# Patient Record
Sex: Female | Born: 1980 | Race: White | Hispanic: No | Marital: Married | Smoking: Never smoker
Health system: Southern US, Community
[De-identification: ages and names within clinical notes are randomized; demographics above are authoritative.]

## PROBLEM LIST (undated history)

## (undated) DIAGNOSIS — R112 Nausea with vomiting, unspecified: Secondary | ICD-10-CM

## (undated) DIAGNOSIS — T8859XA Other complications of anesthesia, initial encounter: Secondary | ICD-10-CM

## (undated) DIAGNOSIS — T7840XA Allergy, unspecified, initial encounter: Secondary | ICD-10-CM

## (undated) DIAGNOSIS — Z9889 Other specified postprocedural states: Secondary | ICD-10-CM

## (undated) DIAGNOSIS — Z8489 Family history of other specified conditions: Secondary | ICD-10-CM

## (undated) DIAGNOSIS — E282 Polycystic ovarian syndrome: Secondary | ICD-10-CM

## (undated) DIAGNOSIS — O24419 Gestational diabetes mellitus in pregnancy, unspecified control: Secondary | ICD-10-CM

## (undated) DIAGNOSIS — K219 Gastro-esophageal reflux disease without esophagitis: Secondary | ICD-10-CM

## (undated) DIAGNOSIS — T4145XA Adverse effect of unspecified anesthetic, initial encounter: Secondary | ICD-10-CM

## (undated) DIAGNOSIS — R87629 Unspecified abnormal cytological findings in specimens from vagina: Secondary | ICD-10-CM

## (undated) HISTORY — PX: WISDOM TOOTH EXTRACTION: SHX21

## (undated) HISTORY — PX: CYST EXCISION: SHX5701

## (undated) HISTORY — DX: Polycystic ovarian syndrome: E28.2

## (undated) HISTORY — DX: Allergy, unspecified, initial encounter: T78.40XA

## (undated) HISTORY — PX: MOUTH SURGERY: SHX715

---
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
## 2012-03-12 HISTORY — PX: HYSTEROSCOPY: SHX211

## 2017-10-25 DIAGNOSIS — Z01419 Encounter for gynecological examination (general) (routine) without abnormal findings: Secondary | ICD-10-CM | POA: Diagnosis not present

## 2017-10-25 LAB — HM PAP SMEAR

## 2017-11-30 DIAGNOSIS — Z23 Encounter for immunization: Secondary | ICD-10-CM | POA: Diagnosis not present

## 2017-12-13 DIAGNOSIS — Z1321 Encounter for screening for nutritional disorder: Secondary | ICD-10-CM | POA: Diagnosis not present

## 2017-12-13 DIAGNOSIS — N978 Female infertility of other origin: Secondary | ICD-10-CM | POA: Diagnosis not present

## 2017-12-13 DIAGNOSIS — E559 Vitamin D deficiency, unspecified: Secondary | ICD-10-CM | POA: Diagnosis not present

## 2017-12-13 DIAGNOSIS — Z369 Encounter for antenatal screening, unspecified: Secondary | ICD-10-CM | POA: Diagnosis not present

## 2017-12-13 DIAGNOSIS — N809 Endometriosis, unspecified: Secondary | ICD-10-CM | POA: Diagnosis not present

## 2017-12-13 DIAGNOSIS — E282 Polycystic ovarian syndrome: Secondary | ICD-10-CM | POA: Diagnosis not present

## 2018-01-01 DIAGNOSIS — Z3200 Encounter for pregnancy test, result unknown: Secondary | ICD-10-CM | POA: Diagnosis not present

## 2018-01-01 DIAGNOSIS — Z3183 Encounter for assisted reproductive fertility procedure cycle: Secondary | ICD-10-CM | POA: Diagnosis not present

## 2018-01-15 DIAGNOSIS — Z3183 Encounter for assisted reproductive fertility procedure cycle: Secondary | ICD-10-CM | POA: Diagnosis not present

## 2018-01-21 DIAGNOSIS — J029 Acute pharyngitis, unspecified: Secondary | ICD-10-CM | POA: Diagnosis not present

## 2018-01-22 DIAGNOSIS — Z3183 Encounter for assisted reproductive fertility procedure cycle: Secondary | ICD-10-CM | POA: Diagnosis not present

## 2018-01-30 DIAGNOSIS — N978 Female infertility of other origin: Secondary | ICD-10-CM | POA: Diagnosis not present

## 2018-01-30 DIAGNOSIS — Z3183 Encounter for assisted reproductive fertility procedure cycle: Secondary | ICD-10-CM | POA: Diagnosis not present

## 2018-02-13 DIAGNOSIS — Z3200 Encounter for pregnancy test, result unknown: Secondary | ICD-10-CM | POA: Diagnosis not present

## 2018-02-17 DIAGNOSIS — Z3200 Encounter for pregnancy test, result unknown: Secondary | ICD-10-CM | POA: Diagnosis not present

## 2018-02-20 DIAGNOSIS — O09819 Supervision of pregnancy resulting from assisted reproductive technology, unspecified trimester: Secondary | ICD-10-CM | POA: Diagnosis not present

## 2018-02-27 DIAGNOSIS — O09819 Supervision of pregnancy resulting from assisted reproductive technology, unspecified trimester: Secondary | ICD-10-CM | POA: Diagnosis not present

## 2018-03-06 DIAGNOSIS — O09819 Supervision of pregnancy resulting from assisted reproductive technology, unspecified trimester: Secondary | ICD-10-CM | POA: Diagnosis not present

## 2018-03-13 DIAGNOSIS — O09819 Supervision of pregnancy resulting from assisted reproductive technology, unspecified trimester: Secondary | ICD-10-CM | POA: Diagnosis not present

## 2018-03-13 DIAGNOSIS — Z3201 Encounter for pregnancy test, result positive: Secondary | ICD-10-CM | POA: Diagnosis not present

## 2018-03-25 DIAGNOSIS — O09891 Supervision of other high risk pregnancies, first trimester: Secondary | ICD-10-CM | POA: Diagnosis not present

## 2018-03-27 ENCOUNTER — Other Ambulatory Visit: Payer: Self-pay | Admitting: Obstetrics and Gynecology

## 2018-03-27 DIAGNOSIS — Z369 Encounter for antenatal screening, unspecified: Secondary | ICD-10-CM

## 2018-03-30 LAB — OB RESULTS CONSOLE RUBELLA ANTIBODY, IGM: Rubella: NON-IMMUNE/NOT IMMUNE

## 2018-03-30 LAB — OB RESULTS CONSOLE VARICELLA ZOSTER ANTIBODY, IGG: Varicella: IMMUNE

## 2018-03-30 LAB — OB RESULTS CONSOLE HEPATITIS B SURFACE ANTIGEN: Hepatitis B Surface Ag: NEGATIVE

## 2018-04-01 DIAGNOSIS — O09891 Supervision of other high risk pregnancies, first trimester: Secondary | ICD-10-CM | POA: Diagnosis not present

## 2018-04-14 ENCOUNTER — Ambulatory Visit
Admission: RE | Admit: 2018-04-14 | Discharge: 2018-04-14 | Disposition: A | Discharge status: Home / Self Care | Payer: 59 | Source: Ambulatory Visit | Attending: Obstetrics and Gynecology | Admitting: Obstetrics and Gynecology

## 2018-04-14 ENCOUNTER — Ambulatory Visit (HOSPITAL_BASED_OUTPATIENT_CLINIC_OR_DEPARTMENT_OTHER)
Admission: RE | Admit: 2018-04-14 | Discharge: 2018-04-14 | Disposition: A | Discharge status: Home / Self Care | Payer: 59 | Source: Ambulatory Visit | Attending: Obstetrics and Gynecology | Admitting: Obstetrics and Gynecology

## 2018-04-14 VITALS — BP 130/83 | HR 115 | Temp 97.6°F | Resp 18 | Ht 64.0 in | Wt 180.0 lb

## 2018-04-14 DIAGNOSIS — Z315 Encounter for genetic counseling: Secondary | ICD-10-CM | POA: Insufficient documentation

## 2018-04-14 DIAGNOSIS — Z369 Encounter for antenatal screening, unspecified: Secondary | ICD-10-CM

## 2018-04-14 HISTORY — DX: Unspecified abnormal cytological findings in specimens from vagina: R87.629

## 2018-04-14 HISTORY — DX: Gastro-esophageal reflux disease without esophagitis: K21.9

## 2018-04-14 HISTORY — DX: Gestational diabetes mellitus in pregnancy, unspecified control: O24.419

## 2018-04-14 NOTE — Progress Notes (Addendum)
Referring Provider:  Christeen Douglas Length of Consultation: 25 minutes  Rebekah Cook was referred to [facility] for genetic counseling because of advanced maternal age.  The patient will be 38 years old at the time of delivery.  This note summarizes the information we discussed.    We explained that the chance of a chromosome abnormality increases with maternal age.  Chromosomes and examples of chromosome problems were reviewed.  Humans typically have 46 chromosomes in each cell, with half passed through each sperm and egg.  Any change in the number or structure of chromosomes can increase the risk of problems in the physical and mental development of a pregnancy.  However, this pregnancy was conceived through IVF performed when the patient was 38 years old.  Therefore, the maternal age related risks would be those of a 37 year old, rather than a 37 year old.  In addition, the patient reported that at the time of her IVF cycle, the couple elected to have the Natera 24 Chromosome Preimplantation Genetic Screen which indicated that the embryo used for this pregnancy was a low risk female embryo. She did not have a copy of these results, but per the [facility] website, the detection rate of this testing is greater than 99%. Even though embryo screening was performed, it is recommended to still offer routine screening and testing during the pregnancy for completeness or in case the pregnancy were to have to occured spontaneously apart from the IVF transfer.  We discussed the following prenatal screening and testing options for this pregnancy:  First trimester screening, which includes nuchal translucency ultrasound screen and first trimester maternal serum marker screening.  The nuchal translucency has approximately an 80% detection rate for Down syndrome and can be positive for other chromosome abnormalities as well as heart defects.  When combined with a maternal serum marker  screening, the detection rate is up to 90% for Down syndrome and up to 97% for trisomy 18.     The chorionic villus sampling procedure is available for first trimester chromosome analysis.  This involves the withdrawal of a small amount of chorionic villi (tissue from the developing placenta).  Risk of pregnancy loss is estimated to be approximately 1 in 200 to 1 in 100 (0.5 to 1%).  There is approximately a 1% (1 in 100) chance that the CVS chromosome results will be unclear.  Chorionic villi cannot be tested for neural tube defects.     Maternal serum marker screening, a blood test that measures pregnancy proteins, can provide risk assessments for Down syndrome, trisomy 18, and open neural tube defects (spina bifida, anencephaly). Because it does not directly examine the fetus, it cannot positively diagnose or rule out these problems.  Targeted ultrasound uses high frequency sound waves to create an image of the developing fetus.  An ultrasound is often recommended as a routine means of evaluating the pregnancy.  It is also used to screen for fetal anatomy problems (for example, a heart defect) that might be suggestive of a chromosomal or other abnormality.   Amniocentesis involves the removal of a small amount of amniotic fluid from the sac surrounding the fetus with the use of a thin needle inserted through the maternal abdomen and uterus.  Ultrasound guidance is used throughout the procedure.  Fetal cells from amniotic fluid are directly evaluated and > 99.5% of chromosome problems and > 98% of open neural tube defects can be detected. This procedure is generally performed after the 15th  week of pregnancy.  The main risks to this procedure include complications leading to miscarriage in less than 1 in 200 cases (0.5%).  We also reviewed the availability of cell free fetal DNA testing from maternal blood to determine whether or not the baby may have either Down syndrome, trisomy 813, or trisomy 4918.   This test utilizes a maternal blood sample and DNA sequencing technology to isolate circulating cell free fetal DNA from maternal plasma.  The fetal DNA can then be analyzed for DNA sequences that are derived from the three most common chromosomes involved in aneuploidy, chromosomes 13, 18, and 21.  If the overall amount of DNA is greater than the expected level for any of these chromosomes, aneuploidy is suspected.  While we do not consider it a replacement for invasive testing and karyotype analysis, a negative result from this testing would be reassuring, though not a guarantee of a normal chromosome complement for the baby.  An abnormal result is certainly suggestive of an abnormal chromosome complement, though we would still recommend CVS or amniocentesis to confirm any findings from this testing.  Cystic Fibrosis and Spinal Muscular Atrophy (SMA) screening were also discussed with the patient. Both conditions are recessive, which means that both parents must be carriers in order to have a child with the disease.  Cystic fibrosis (CF) is one of the most common genetic conditions in persons of Caucasian ancestry.  This condition occurs in approximately 1 in 2,500 Caucasian persons and results in thickened secretions in the lungs, digestive, and reproductive systems.  For a baby to be at risk for having CF, both of the parents must be carriers for this condition.  Approximately 1 in 7025 Caucasian persons is a carrier for CF.  Current carrier testing looks for the most common mutations in the gene for CF and can detect approximately 90% of carriers in the Caucasian population.  This means that the carrier screening can greatly reduce, but cannot eliminate, the chance for an individual to have a child with CF.  If an individual is found to be a carrier for CF, then carrier testing would be available for the partner. As part of [name] newborn screening profile, all babies born in the [place] will have a two-tier screening process.  Specimens are first tested to determine the concentration of immunoreactive trypsinogen (IRT).  The top 5% of specimens with the highest IRT values then undergo DNA testing using a panel of over 40 common CF mutations. SMA is a neurodegenerative disorder that leads to atrophy of skeletal muscle and overall weakness.  This condition is also more prevalent in the Caucasian population, with 1 in 40-1 in 60 persons being a carrier and 1 in 6,000-1 in 10,000 children being affected.  There are multiple forms of the disease, with some causing death in infancy to other forms with survival into adulthood.  The genetics of SMA is complex, but carrier screening can detect up to 95% of carriers in the Caucasian population.  Similar to CF, a negative result can greatly reduce, but cannot eliminate, the chance to have a child with SMA. Rebekah Cook stated that this carrier screening was performed prior to her IVF cycle and was all normal.  We inquired about the family history and pregnancy history.  Rebekah Cook reported that the family history is unremarkable for birth defects, developmental delays, recurrent pregnancy loss or known chromosome abnormalities.  Rebekah Cook stated that this is her second pregnancy.  The couple has a 79 year old daughter who is in good health.  She reported no complications or exposures in this pregnancy that are expected to increase the risk for birth defects.  After consideration of the options, Rebekah Cook wanted to consider MaterniT21 testing depending upon insurance coverage.  I called the lab and was told that her estimated cost is $125.  She was informed of this and planned to speak with her husband and call back with her decision about this testing. She was scheduled to return to [facility] at [redacted] weeks gestation for an anatomy ultrasound and a request for the fetal echocardiogram was also sent to [facility]  in [place].  An ultrasound was performed at the time of the visit.  The gestational age was consistent with  13 weeks.  Fetal anatomy could not be assessed due to early gestational age.  Please refer to the ultrasound report for details of that study. We recommend a detailed anatomy ultrasound in the second trimester.  We also offered the option of a fetal echocardiogram after [redacted] weeks gestation due to the history of IVF in this pregnancy.  Rebekah Cook was encouraged to call with questions or concerns.  We can be contacted at [phone].   Tests Ordered: none today  Cherly Anderson, MS, CGC  I saw the patient and concur with the genetic counselors summary  Jimmey Ralph, MD

## 2018-04-17 DIAGNOSIS — Z3A13 13 weeks gestation of pregnancy: Secondary | ICD-10-CM | POA: Diagnosis not present

## 2018-04-17 DIAGNOSIS — J101 Influenza due to other identified influenza virus with other respiratory manifestations: Secondary | ICD-10-CM | POA: Diagnosis not present

## 2018-04-17 DIAGNOSIS — R6889 Other general symptoms and signs: Secondary | ICD-10-CM | POA: Diagnosis not present

## 2018-04-30 DIAGNOSIS — O0992 Supervision of high risk pregnancy, unspecified, second trimester: Secondary | ICD-10-CM | POA: Diagnosis not present

## 2018-05-15 ENCOUNTER — Other Ambulatory Visit: Payer: Self-pay

## 2018-05-15 DIAGNOSIS — O09522 Supervision of elderly multigravida, second trimester: Secondary | ICD-10-CM

## 2018-05-19 ENCOUNTER — Ambulatory Visit
Admission: RE | Admit: 2018-05-19 | Discharge: 2018-05-19 | Disposition: A | Discharge status: Home / Self Care | Payer: 59 | Source: Ambulatory Visit | Attending: Obstetrics and Gynecology | Admitting: Obstetrics and Gynecology

## 2018-05-19 DIAGNOSIS — O09522 Supervision of elderly multigravida, second trimester: Secondary | ICD-10-CM

## 2018-07-24 ENCOUNTER — Other Ambulatory Visit: Payer: Self-pay

## 2018-07-24 DIAGNOSIS — O09522 Supervision of elderly multigravida, second trimester: Secondary | ICD-10-CM

## 2018-07-28 ENCOUNTER — Ambulatory Visit
Admission: RE | Admit: 2018-07-28 | Discharge: 2018-07-28 | Disposition: A | Discharge status: Home / Self Care | Payer: 59 | Source: Ambulatory Visit | Attending: Maternal & Fetal Medicine | Admitting: Maternal & Fetal Medicine

## 2018-07-28 ENCOUNTER — Other Ambulatory Visit: Payer: Self-pay

## 2018-07-28 DIAGNOSIS — Z3A28 28 weeks gestation of pregnancy: Secondary | ICD-10-CM | POA: Insufficient documentation

## 2018-07-28 DIAGNOSIS — O09522 Supervision of elderly multigravida, second trimester: Secondary | ICD-10-CM | POA: Diagnosis not present

## 2018-08-27 ENCOUNTER — Other Ambulatory Visit: Payer: Self-pay

## 2018-08-27 ENCOUNTER — Encounter: Payer: 59 | Attending: Obstetrics and Gynecology | Admitting: *Deleted

## 2018-08-27 ENCOUNTER — Encounter: Payer: Self-pay | Admitting: *Deleted

## 2018-08-27 VITALS — BP 114/70 | Ht 64.0 in | Wt 195.8 lb

## 2018-08-27 DIAGNOSIS — O24410 Gestational diabetes mellitus in pregnancy, diet controlled: Secondary | ICD-10-CM | POA: Insufficient documentation

## 2018-08-27 DIAGNOSIS — Z3A00 Weeks of gestation of pregnancy not specified: Secondary | ICD-10-CM | POA: Diagnosis not present

## 2018-08-27 NOTE — Progress Notes (Signed)
Diabetes Self-Management Education  Visit Type: First/Initial  Appt. Start Time: 0915 Appt. End Time: 1050  08/27/2018  [name], identified by name and date of birth, is a 38 y.o. female with a diagnosis of Diabetes: Gestational Diabetes.   ASSESSMENT  Blood pressure 114/70, height 5\' 4"  (1.626 m), weight 195 lb 12.8 oz (88.8 kg), last menstrual period 01/11/2018, estimated due date 10/18/2018 Body mass index is 33.61 kg/m.  Diabetes Self-Management Education - 08/27/18 1023      Visit Information   Visit Type  First/Initial      Initial Visit   Diabetes Type  Gestational Diabetes    Are you currently following a meal plan?  No    Are you taking your medications as prescribed?  Yes    Date Diagnosed  last week      Health Coping   How would you rate your overall health?  Good      Psychosocial Assessment   Patient Belief/Attitude about Diabetes  Motivated to manage diabetes    Self-care barriers  None    Self-management support  Doctor's office;Family    Patient Concerns  Nutrition/Meal planning;Monitoring;Healthy Lifestyle;Other (comment)   "have a healthy baby that stays put until term"   Special Needs  None    Preferred Learning Style  Visual    Learning Readiness  Ready    How often do you need to have someone help you when you read instructions, pamphlets, or other written materials from your doctor or pharmacy?  1 - Never    What is the last grade level you completed in school?  BS      Pre-Education Assessment   Patient understands the diabetes disease and treatment process.  Needs Review    Patient understands incorporating nutritional management into lifestyle.  Needs Instruction    Patient undertands incorporating physical activity into lifestyle.  Needs Review    Patient understands using medications safely.  Needs Instruction    Patient understands monitoring blood glucose, interpreting and using results  Needs Instruction    Patient understands  prevention, detection, and treatment of acute complications.  Needs Instruction    Patient understands prevention, detection, and treatment of chronic complications.  Needs Review    Patient understands how to develop strategies to address psychosocial issues.  Needs Review    Patient understands how to develop strategies to promote health/change behavior.  Needs Review      Complications   Last HgB A1C per patient/outside source  5.5 %   08/07/2018   How often do you check your blood sugar?  0 times/day (not testing)   Provided One Touch Verio Flex meter and instructed on use. BG upon return demonstration was 83 mg/dL at 09:8110:40 am - 3 hrs pp.   Have you had a dilated eye exam in the past 12 months?  Yes    Have you had a dental exam in the past 12 months?  Yes    Are you checking your feet?  No      Dietary Intake   Breakfast  milk, egg sandwich, [employer] breakfast bowl    Lunch  tuna and crackers, Malaw[place]urkey and cheese with triskets, chicken rice and broccoli, fruit (apple, mandarin oranges, grapes, banana    Snack (afternoon)  pretzels, hummus, carrots, popcorn    Dinner  chicken and fish; potatoes, corn, rice, broccoli, lettuce tomatoes, spinach    Beverage(s)  water, milk, juice      Exercise   Exercise  Type  Light (walking / raking leaves)    How many days per week to you exercise?  4    How many minutes per day do you exercise?  20    Total minutes per week of exercise  80      Patient Education   Previous Diabetes Education  Yes (please comment)   in [place] 4 years ago with first pregnany   Disease state   Definition of diabetes, type 1 and 2, and the diagnosis of diabetes;Factors that contribute to the development of diabetes    Nutrition management   Role of diet in the treatment of diabetes and the relationship between the three main macronutrients and blood glucose level;Reviewed blood glucose goals for pre and post meals and how to evaluate the patients' food intake on  their blood glucose level.    Physical activity and exercise   Role of exercise on diabetes management, blood pressure control and cardiac health.    Monitoring  Taught/evaluated SMBG meter.;Purpose and frequency of SMBG.;Taught/discussed recording of test results and interpretation of SMBG.;Ketone testing, when, how.    Chronic complications  Relationship between chronic complications and blood glucose control    Psychosocial adjustment  Identified and addressed patients feelings and concerns about diabetes    Preconception care  Pregnancy and GDM  Role of pre-pregnancy blood glucose control on the development of the fetus;Reviewed with patient blood glucose goals with pregnancy;Role of family planning for patients with diabetes      Individualized Goals (developed by patient)   Reducing Risk Prevent diabetes complications Lead a healthier lifestyle Have a healthy baby until term     Outcomes   Expected Outcomes  Demonstrated interest in learning. Expect positive outcomes       Individualized Plan for Diabetes Self-Management Training:   Learning Objective:  Patient will have a greater understanding of diabetes self-management. Patient education plan is to attend individual and/or group sessions per assessed needs and concerns.   Plan:   Patient Instructions  Read booklet on Gestational Diabetes Follow Gestational Meal Planning Guidelines Avoid sugar sweetened drinks (juice) Complete a 3 Day Food Record and bring to next appointment Check blood sugars 4 x day - before breakfast and 2 hrs after every meal and record  Bring blood sugar log to all appointments Call MD for prescription for meter strips and lancets Strips   One Touch Verio Lancets   One Touch Delica Purchase urine ketone strips if ordered by MD and check urine ketones every am:  If + increase bedtime snack to 1 protein and 2 carbohydrate servings Walk 20-30 minutes at least 5 x week if permitted by MD  Expected  Outcomes:  Demonstrated interest in learning. Expect positive outcomes  Education material provided:  Gestational Booklet Gestational Meal Planning Guidelines Simple Meal Plan Viewed Gestational Diabetes Video Meter = One Touch Verio Flex 3 Day Food Record Goals for a Healthy Pregnancy  If problems or questions, patient to contact team via:  Johny Drilling, RN, [place], CDE [phone]  Future DSME appointment: September 01, 2018 with the dietitian

## 2018-08-27 NOTE — Patient Instructions (Signed)
Read booklet on Gestational Diabetes Follow Gestational Meal Planning Guidelines Avoid sugar sweetened drinks (juice) Complete a 3 Day Food Record and bring to next appointment Check blood sugars 4 x day - before breakfast and 2 hrs after every meal and record  Bring blood sugar log to all appointments Call MD for prescription for meter strips and lancets Strips   One Touch Verio Lancets   One Touch Delica Purchase urine ketone strips if ordered by MD and check urine ketones every am:  If + increase bedtime snack to 1 protein and 2 carbohydrate servings Walk 20-30 minutes at least 5 x week if permitted by MD

## 2018-09-01 ENCOUNTER — Encounter: Payer: Self-pay | Admitting: Dietician

## 2018-09-01 ENCOUNTER — Encounter: Payer: 59 | Admitting: Dietician

## 2018-09-01 ENCOUNTER — Other Ambulatory Visit: Payer: Self-pay

## 2018-09-01 VITALS — BP 118/70 | Ht 64.0 in | Wt 195.0 lb

## 2018-09-01 DIAGNOSIS — O24410 Gestational diabetes mellitus in pregnancy, diet controlled: Secondary | ICD-10-CM

## 2018-09-01 NOTE — Patient Instructions (Addendum)
Balanced breakfast with 2 servings of carbohydrate and at least 2 oz of protein. Avoid fruit and fruit juice at breakfast. Balance lunch and dinner with 2-4 oz protein, 2-4 servings of carbohydrate and non-starchy vegetables. Include 3 milk products per day (milk, yogurt, cheese). Follow guidelines on Food Safety regarding prevention of listeriosis and food borne illness as well as fish recommendations. Continue to check blood sugars: before breakfast and 2 hours after all meals.

## 2018-09-01 NOTE — Progress Notes (Signed)
Diabetes Self-Management Education  Visit Type:  Follow-up  Appt. Start Time: 10:30am  Appt. End Time: 11:30 am  09/01/2018  Rebekah Cook, identified by name and date of birth, is a 38 y.o. female with a diagnosis of Diabetes: Gestational diabetes ASSESSMENT  Blood pressure 118/70, height 5\' 4"  (1.626 m), weight 195 lb (88.5 kg), last menstrual period 01/11/2018. Body mass index is 33.47 kg/m.   Diabetes Self-Management Education - 09/01/18 1138      Complications   Last HgB A1C per patient/outside source  5.5 %    How often do you check your blood sugar?  3-4 times/day    Fasting Blood glucose range (mg/dL)  [phone]    Postprandial Blood glucose range (mg/dL)  [phone]    Have you had a dilated eye exam in the past 12 months?  Yes    Have you had a dental exam in the past 12 months?  Yes    Are you checking your feet?  No      Dietary Intake   Breakfast  8-9:30am- breakfast bowl, 8-10 oz milk or 1 slice bread, peanut butter,milk    Lunch  12-1:30pm- 6 triscuits, cheese, 3 slices Malawiturkey or 12 grilled chicken nuggets, 12 grapes, 6 triscuits or 12 saltines, cheese, Mal[place]iturkey, mayo    Snack (afternoon)  fruit or carrots and hummus    Dinner  4:30-7:00- 2 hard boiled eggs, 2 slices ww bread, 1 apple or 2 slices bread, tuna with mayo, apple or grilled salmon, broccoli, mashed potatos    Snack (evening)  popcorn -approx. 6 cups    Beverage(s)  water, milk      Exercise   Exercise Type  ADL's      Patient Education   Nutrition management   Role of diet in the treatment of diabetes and the relationship between the three main macronutrients and blood glucose level;Food label reading, portion sizes and measuring food.;Carbohydrate counting;Reviewed blood glucose goals for pre and post meals and how to evaluate the patients' food intake on their blood glucose level.    Monitoring  Taught/discussed recording of test results and interpretation of SMBG.    Preconception care  Pregnancy and  GDM  Role of pre-pregnancy blood glucose control on the development of the fetus;Reviewed with patient blood glucose goals with pregnancy;Role of family planning for patients with diabetes      Individualized Goals (developed by patient)   Nutrition  Follow meal plan discussed;General guidelines for healthy choices and portions discussed    Monitoring   test my blood glucose as discussed      Post-Education Assessment   Patient understands the diabetes disease and treatment process.  Demonstrates understanding / competency    Patient understands incorporating nutritional management into lifestyle.  Demonstrates understanding / competency    Patient undertands incorporating physical activity into lifestyle.  Demonstrates understanding / competency    Patient understands monitoring blood glucose, interpreting and using results  Demonstrates understanding / competency    Patient understands prevention, detection, and treatment of acute complications.  Demonstrates understanding / competency    Patient understands prevention, detection, and treatment of chronic complications.  Demonstrates understanding / competency    Patient understands how to develop strategies to address psychosocial issues.  Demonstrates understanding / competency    Patient understands how to develop strategies to promote health/change behavior.  Demonstrates understanding / competency      Outcomes   Program Status  Completed       Learning Objective:  Patient will have a greater understanding of diabetes self-management for Gestational diabetes  Education: Instructed on a meal plan for Gestational diabetes based on 1900 calories including carbohydrate counting, portion control and how to balance carbohydrate, protein, healthy fats and non-starchy vegetables.  Instructed patient on food safety, including avoidance of Listeriosis, and limiting mercury from fish.  Discussed importance of maintaining healthy lifestyle  habits to reduce risk of Type 2 DM as well as Gestational DM with any future pregnancies.  Advised patient to use any remaining testing supplies to test some BGs after delivery, and to have BG tested ideally annually, as well as prior to attempting future pregnancies.  Plan:   Patient Instructions  Balanced breakfast with 2 servings of carbohydrate and at least 2 oz of protein. Avoid fruit and fruit juice at breakfast. Balance lunch and dinner with 2-4 oz protein, 2-4 servings of carbohydrate and non-starchy vegetables. Include 3 milk products per day (milk, yogurt, cheese). Follow guidelines on Food Safety regarding prevention of listeriosis and food borne illness as well as fish recommendations. Continue to check blood sugars: before breakfast and 2 hours after all meals.   Expected Outcomes:  Demonstrated interest in learning. Expect positive outcomes  Education material provided:  Food guide plate Planning a Balanced Meal Menu examples Food Safety Recommendations regarding fish intake Preventing diabetes-post pregnancy  If problems or questions, patient to contact team via:   Karolee Stamps, [place], [place]  Future DSME appointment: - PRN

## 2018-09-23 LAB — OB RESULTS CONSOLE RPR: RPR: NONREACTIVE

## 2018-09-23 LAB — OB RESULTS CONSOLE GC/CHLAMYDIA
Chlamydia: NEGATIVE
Gonorrhea: NEGATIVE

## 2018-09-23 LAB — OB RESULTS CONSOLE HIV ANTIBODY (ROUTINE TESTING): HIV: NONREACTIVE

## 2018-09-23 LAB — OB RESULTS CONSOLE GBS: GBS: NEGATIVE

## 2018-10-06 ENCOUNTER — Other Ambulatory Visit: Payer: Self-pay

## 2018-10-06 ENCOUNTER — Encounter
Admission: RE | Admit: 2018-10-06 | Discharge: 2018-10-06 | Disposition: A | Discharge status: Home / Self Care | Payer: 59 | Source: Ambulatory Visit | Attending: Obstetrics & Gynecology | Admitting: Obstetrics & Gynecology

## 2018-10-06 DIAGNOSIS — Z01818 Encounter for other preprocedural examination: Secondary | ICD-10-CM | POA: Insufficient documentation

## 2018-10-06 DIAGNOSIS — Z20828 Contact with and (suspected) exposure to other viral communicable diseases: Secondary | ICD-10-CM | POA: Diagnosis present

## 2018-10-06 HISTORY — DX: Other specified postprocedural states: R11.2

## 2018-10-06 HISTORY — DX: Other complications of anesthesia, initial encounter: T88.59XA

## 2018-10-06 HISTORY — DX: Family history of other specified conditions: Z84.89

## 2018-10-06 HISTORY — DX: Other specified postprocedural states: Z98.890

## 2018-10-06 NOTE — Patient Instructions (Signed)
Your procedure is scheduled on: 10-13-18 MONDAY Report to [place] TO LABOR AND DELIVERY ON 3RD FLOOR-ARRIVE AT 5:30 AM  Remember: Instructions that are not followed completely may result in serious medical risk, up to and including death, or upon the discretion of your surgeon and anesthesiologist your surgery may need to be rescheduled.    _x___ 1. Do not eat food after midnight the night before your procedure. NO GUM OR CANDY AFTER MIDNIGHT. You may drink clear liquids up to 2 hours before you are scheduled to arrive at the hospital for your procedure.  Do not drink clear liquids within 2 hours of your scheduled arrival to the hospital.  Clear liquids include  --Water or Apple juice without pulp  --Clear carbohydrate beverage such as ClearFast or Gatorade  --Black Coffee or Clear Tea (No milk, no creamers, do not add anything to the coffee or Tea   ____Ensure clear carbohydrate drink on the way to the hospital for bariatric patients  ____Ensure clear carbohydrate drink 3 hours before surgery.     __x__ 2. No Alcohol for 24 hours before or after surgery.   __x__3. No Smoking or e-cigarettes for 24 prior to surgery.  Do not use any chewable tobacco products for at least 6 hour prior to surgery   ____  4. Bring all medications with you on the day of surgery if instructed.    __x__ 5. Notify your doctor if there is any change in your medical condition     (cold, fever, infections).    x___6. On the morning of surgery brush your teeth with toothpaste and water.  You may rinse your mouth with mouth wash if you wish.  Do not swallow any toothpaste or mouthwash.   Do not wear jewelry, make-up, hairpins, clips or nail polish.  Do not wear lotions, powders, or perfumes. You may wear deodorant.  Do not shave 48 hours prior to surgery. Men may shave face and neck.  Do not bring valuables to the hospital.    [facility] is not responsible for any belongings  or valuables.               Contacts, dentures or bridgework may not be worn into surgery.  Leave your suitcase in the car. After surgery it may be brought to your room.  For patients admitted to the hospital, discharge time is determined by your treatment team.  _  Patients discharged the day of surgery will not be allowed to drive home.  You will need someone to drive you home and stay with you the night of your procedure.    Please read over the following fact sheets that you were given:   [facility] Preparing for Surgery   _x___ TAKE THE FOLLOWING MEDICATION THE MORNING OF SURGERY WITH A SMALL SIP OF WATER. These include:  1. PEPCID (FAMOTIDINE)  2.  3.  4.  5.  6.  ____Fleets enema or Magnesium Citrate as directed.   _x___ Use CHG Soap or sage wipes as directed on instruction sheet   ____ Use inhalers on the day of surgery and bring to hospital day of surgery  ____ Stop Metformin and Janumet 2 days prior to surgery.    ____ Take 1/2 of usual insulin dose the night before surgery and none on the morning surgery.   ____ Follow recommendations from Cardiologist, Pulmonologist or PCP regarding  stopping Aspirin, Coumadin, Plavix ,Eliquis, Effient, or Pradaxa, and Pletal.  X____Stop  Anti-inflammatories such as Advil, Aleve, Ibuprofen, Motrin, Naproxen, Naprosyn, Goodies powders or aspirin products NOW-OK to take Tylenol    ____ Stop supplements until after surgery.   ____ Bring C-Pap to the hospital.

## 2018-10-09 ENCOUNTER — Ambulatory Visit
Admission: RE | Admit: 2018-10-09 | Discharge: 2018-10-09 | Disposition: A | Discharge status: Home / Self Care | Payer: 59 | Source: Ambulatory Visit | Attending: Obstetrics & Gynecology | Admitting: Obstetrics & Gynecology

## 2018-10-09 ENCOUNTER — Other Ambulatory Visit: Payer: Self-pay

## 2018-10-09 DIAGNOSIS — Z20828 Contact with and (suspected) exposure to other viral communicable diseases: Secondary | ICD-10-CM | POA: Insufficient documentation

## 2018-10-09 LAB — SARS CORONAVIRUS 2 (TAT 6-24 HRS): SARS Coronavirus 2: NEGATIVE

## 2018-10-13 ENCOUNTER — Inpatient Hospital Stay: Payer: 59 | Admitting: Anesthesiology

## 2018-10-13 ENCOUNTER — Encounter
Admission: RE | Disposition: A | Discharge status: Home / Self Care | Payer: Self-pay | Source: Home / Self Care | Attending: Obstetrics & Gynecology

## 2018-10-13 ENCOUNTER — Inpatient Hospital Stay
Admission: RE | Admit: 2018-10-13 | Discharge: 2018-10-15 | DRG: 787 | Disposition: A | Discharge status: Home / Self Care | Payer: 59 | Attending: Obstetrics & Gynecology | Admitting: Obstetrics & Gynecology

## 2018-10-13 ENCOUNTER — Other Ambulatory Visit: Payer: Self-pay

## 2018-10-13 DIAGNOSIS — E669 Obesity, unspecified: Secondary | ICD-10-CM | POA: Diagnosis present

## 2018-10-13 DIAGNOSIS — Z3A39 39 weeks gestation of pregnancy: Secondary | ICD-10-CM

## 2018-10-13 DIAGNOSIS — O34219 Maternal care for unspecified type scar from previous cesarean delivery: Secondary | ICD-10-CM

## 2018-10-13 DIAGNOSIS — O24420 Gestational diabetes mellitus in childbirth, diet controlled: Secondary | ICD-10-CM | POA: Diagnosis present

## 2018-10-13 DIAGNOSIS — O9081 Anemia of the puerperium: Secondary | ICD-10-CM | POA: Diagnosis not present

## 2018-10-13 DIAGNOSIS — O34211 Maternal care for low transverse scar from previous cesarean delivery: Principal | ICD-10-CM | POA: Diagnosis present

## 2018-10-13 DIAGNOSIS — O09529 Supervision of elderly multigravida, unspecified trimester: Secondary | ICD-10-CM

## 2018-10-13 DIAGNOSIS — Z880 Allergy status to penicillin: Secondary | ICD-10-CM | POA: Diagnosis not present

## 2018-10-13 DIAGNOSIS — D62 Acute posthemorrhagic anemia: Secondary | ICD-10-CM | POA: Diagnosis not present

## 2018-10-13 DIAGNOSIS — O09819 Supervision of pregnancy resulting from assisted reproductive technology, unspecified trimester: Secondary | ICD-10-CM

## 2018-10-13 DIAGNOSIS — O99214 Obesity complicating childbirth: Secondary | ICD-10-CM | POA: Diagnosis present

## 2018-10-13 DIAGNOSIS — O24419 Gestational diabetes mellitus in pregnancy, unspecified control: Secondary | ICD-10-CM

## 2018-10-13 LAB — CBC
HCT: 36.2 % (ref 36.0–46.0)
Hemoglobin: 11.5 g/dL — ABNORMAL LOW (ref 12.0–15.0)
MCH: 26.1 pg (ref 26.0–34.0)
MCHC: 31.8 g/dL (ref 30.0–36.0)
MCV: 82.1 fL (ref 80.0–100.0)
Platelets: 190 10*3/uL (ref 150–400)
RBC: 4.41 MIL/uL (ref 3.87–5.11)
RDW: 14.6 % (ref 11.5–15.5)
WBC: 11 10*3/uL — ABNORMAL HIGH (ref 4.0–10.5)
nRBC: 0 % (ref 0.0–0.2)

## 2018-10-13 LAB — TYPE AND SCREEN
ABO/RH(D): O POS
Antibody Screen: NEGATIVE

## 2018-10-13 LAB — ABO/RH: ABO/RH(D): O POS

## 2018-10-13 SURGERY — Surgical Case
Anesthesia: General

## 2018-10-13 MED ORDER — LACTATED RINGERS IV SOLN
Freq: Once | INTRAVENOUS | Status: AC
  Administered 2018-10-13: 08:00:00 via INTRAVENOUS

## 2018-10-13 MED ORDER — PHENYLEPHRINE HCL (PRESSORS) 10 MG/ML IV SOLN
INTRAVENOUS | Status: AC
  Filled 2018-10-13: qty 1

## 2018-10-13 MED ORDER — OXYCODONE HCL 5 MG PO TABS
5.0000 mg | ORAL_TABLET | ORAL | Status: DC | PRN
  Administered 2018-10-13 – 2018-10-15 (×5): 10 mg via ORAL
  Filled 2018-10-13 (×5): qty 2

## 2018-10-13 MED ORDER — CLINDAMYCIN PHOSPHATE 900 MG/50ML IV SOLN
900.0000 mg | INTRAVENOUS | Status: AC
  Administered 2018-10-13: 900 mg via INTRAVENOUS
  Filled 2018-10-13: qty 50

## 2018-10-13 MED ORDER — BUPIVACAINE HCL 0.5 % IJ SOLN
INTRAMUSCULAR | Status: DC | PRN
  Administered 2018-10-13: 30 mL

## 2018-10-13 MED ORDER — SODIUM CHLORIDE 0.9 % IV SOLN: 250.0000 mL | INTRAVENOUS | Status: DC

## 2018-10-13 MED ORDER — LACTATED RINGERS IV SOLN
Freq: Once | INTRAVENOUS | Status: AC
  Administered 2018-10-13: 07:00:00 via INTRAVENOUS

## 2018-10-13 MED ORDER — MEASLES, MUMPS & RUBELLA VAC IJ SOLR
0.5000 mL | Freq: Once | INTRAMUSCULAR | Status: AC
  Administered 2018-10-15: 15:00:00 0.5 mL via SUBCUTANEOUS
  Filled 2018-10-13 (×2): qty 0.5

## 2018-10-13 MED ORDER — SODIUM CHLORIDE (PF) 0.9 % IJ SOLN
INTRAMUSCULAR | Status: AC
  Filled 2018-10-13: qty 50

## 2018-10-13 MED ORDER — IBUPROFEN 600 MG PO TABS
600.0000 mg | ORAL_TABLET | Freq: Four times a day (QID) | ORAL | Status: DC
  Administered 2018-10-14 – 2018-10-15 (×5): 600 mg via ORAL
  Filled 2018-10-13 (×5): qty 1

## 2018-10-13 MED ORDER — PROPOFOL 10 MG/ML IV BOLUS
INTRAVENOUS | Status: AC
  Filled 2018-10-13: qty 20

## 2018-10-13 MED ORDER — FENTANYL CITRATE (PF) 100 MCG/2ML IJ SOLN
INTRAMUSCULAR | Status: AC
  Filled 2018-10-13: qty 2

## 2018-10-13 MED ORDER — ACETAMINOPHEN 500 MG PO TABS
1000.0000 mg | ORAL_TABLET | Freq: Four times a day (QID) | ORAL | Status: DC
  Administered 2018-10-13 – 2018-10-15 (×5): 1000 mg via ORAL
  Filled 2018-10-13 (×3): qty 2

## 2018-10-13 MED ORDER — KETOROLAC TROMETHAMINE 30 MG/ML IJ SOLN
INTRAMUSCULAR | Status: AC
  Filled 2018-10-13: qty 1

## 2018-10-13 MED ORDER — NALOXONE HCL 0.4 MG/ML IJ SOLN: 0.4000 mg | INTRAMUSCULAR | Status: DC | PRN

## 2018-10-13 MED ORDER — ONDANSETRON HCL 4 MG/2ML IJ SOLN
INTRAMUSCULAR | Status: DC | PRN
  Administered 2018-10-13 (×2): 4 mg via INTRAVENOUS

## 2018-10-13 MED ORDER — SOD CITRATE-CITRIC ACID 500-334 MG/5ML PO SOLN
ORAL | Status: AC
  Administered 2018-10-13: 07:00:00 30 mL via ORAL
  Filled 2018-10-13: qty 30

## 2018-10-13 MED ORDER — EPHEDRINE SULFATE 50 MG/ML IJ SOLN
INTRAMUSCULAR | Status: DC | PRN
  Administered 2018-10-13: 15 mg via INTRAVENOUS

## 2018-10-13 MED ORDER — SODIUM CHLORIDE 0.9 % IV SOLN
INTRAVENOUS | Status: DC | PRN
  Administered 2018-10-13: 25 ug/min via INTRAVENOUS

## 2018-10-13 MED ORDER — BUPIVACAINE LIPOSOME 1.3 % IJ SUSP: 20.0000 mL | Freq: Once | INTRAMUSCULAR | Status: DC

## 2018-10-13 MED ORDER — OXYTOCIN 40 UNITS IN NORMAL SALINE INFUSION - SIMPLE MED
INTRAVENOUS | Status: AC
  Administered 2018-10-13: 13:00:00
  Filled 2018-10-13: qty 1000

## 2018-10-13 MED ORDER — ONDANSETRON HCL 4 MG/2ML IJ SOLN
INTRAMUSCULAR | Status: AC
  Filled 2018-10-13: qty 2

## 2018-10-13 MED ORDER — LACTATED RINGERS IV SOLN
INTRAVENOUS | Status: DC
  Administered 2018-10-14: 04:00:00 via INTRAVENOUS

## 2018-10-13 MED ORDER — OXYTOCIN 40 UNITS IN NORMAL SALINE INFUSION - SIMPLE MED
INTRAVENOUS | Status: AC
  Filled 2018-10-13: qty 1000

## 2018-10-13 MED ORDER — FENTANYL CITRATE (PF) 100 MCG/2ML IJ SOLN
INTRAMUSCULAR | Status: DC | PRN
  Administered 2018-10-13: 20 ug via INTRATHECAL

## 2018-10-13 MED ORDER — GLYCOPYRROLATE 0.2 MG/ML IJ SOLN
INTRAMUSCULAR | Status: AC
  Filled 2018-10-13: qty 1

## 2018-10-13 MED ORDER — SIMETHICONE 80 MG PO CHEW
160.0000 mg | CHEWABLE_TABLET | Freq: Four times a day (QID) | ORAL | Status: DC | PRN
  Administered 2018-10-13 – 2018-10-14 (×2): 160 mg via ORAL
  Filled 2018-10-13 (×3): qty 2

## 2018-10-13 MED ORDER — SOD CITRATE-CITRIC ACID 500-334 MG/5ML PO SOLN
30.0000 mL | ORAL | Status: AC
  Administered 2018-10-13: 07:00:00 30 mL via ORAL

## 2018-10-13 MED ORDER — SCOPOLAMINE 1 MG/3DAYS TD PT72: 1.0000 | MEDICATED_PATCH | TRANSDERMAL | Status: DC

## 2018-10-13 MED ORDER — COCONUT OIL OIL: 1.0000 "application " | TOPICAL_OIL | Status: DC | PRN

## 2018-10-13 MED ORDER — BUPIVACAINE HCL (PF) 0.5 % IJ SOLN
INTRAMUSCULAR | Status: AC
  Filled 2018-10-13: qty 30

## 2018-10-13 MED ORDER — BUPIVACAINE LIPOSOME 1.3 % IJ SUSP
INTRAMUSCULAR | Status: AC
  Filled 2018-10-13: qty 20

## 2018-10-13 MED ORDER — OXYTOCIN 40 UNITS IN NORMAL SALINE INFUSION - SIMPLE MED
2.5000 [IU]/h | INTRAVENOUS | Status: AC
  Filled 2018-10-13: qty 1000

## 2018-10-13 MED ORDER — BUPIVACAINE IN DEXTROSE 0.75-8.25 % IT SOLN
INTRATHECAL | Status: DC | PRN
  Administered 2018-10-13: 1.6 mL via INTRATHECAL

## 2018-10-13 MED ORDER — WITCH HAZEL-GLYCERIN EX PADS: 1.0000 "application " | MEDICATED_PAD | CUTANEOUS | Status: DC | PRN

## 2018-10-13 MED ORDER — SODIUM CHLORIDE 0.9% FLUSH: 3.0000 mL | INTRAVENOUS | Status: DC | PRN

## 2018-10-13 MED ORDER — DIBUCAINE (PERIANAL) 1 % EX OINT: 1.0000 "application " | TOPICAL_OINTMENT | CUTANEOUS | Status: DC | PRN

## 2018-10-13 MED ORDER — BUPIVACAINE HCL (PF) 0.5 % IJ SOLN: 30.0000 mL | Freq: Once | INTRAMUSCULAR | Status: DC

## 2018-10-13 MED ORDER — DIPHENHYDRAMINE HCL 25 MG PO CAPS: 25.0000 mg | ORAL_CAPSULE | Freq: Four times a day (QID) | ORAL | Status: DC | PRN

## 2018-10-13 MED ORDER — KETOROLAC TROMETHAMINE 30 MG/ML IJ SOLN
30.0000 mg | Freq: Four times a day (QID) | INTRAMUSCULAR | Status: AC | PRN
  Administered 2018-10-13 – 2018-10-14 (×2): 30 mg via INTRAVENOUS
  Filled 2018-10-13 (×2): qty 1

## 2018-10-13 MED ORDER — SODIUM CHLORIDE 0.9 % IV SOLN
INTRAVENOUS | Status: DC | PRN
  Administered 2018-10-13: 09:00:00 70 mL

## 2018-10-13 MED ORDER — SODIUM CHLORIDE 0.9% FLUSH: 3.0000 mL | Freq: Two times a day (BID) | INTRAVENOUS | Status: DC

## 2018-10-13 MED ORDER — KETOROLAC TROMETHAMINE 30 MG/ML IJ SOLN: 30.0000 mg | Freq: Four times a day (QID) | INTRAMUSCULAR | Status: AC | PRN

## 2018-10-13 MED ORDER — PHENYLEPHRINE HCL (PRESSORS) 10 MG/ML IV SOLN
INTRAVENOUS | Status: DC | PRN
  Administered 2018-10-13: 100 ug via INTRAVENOUS
  Administered 2018-10-13: 50 ug via INTRAVENOUS
  Administered 2018-10-13 (×3): 100 ug via INTRAVENOUS

## 2018-10-13 MED ORDER — ACETAMINOPHEN 650 MG RE SUPP
650.0000 mg | Freq: Once | RECTAL | Status: AC
  Administered 2018-10-13: 08:00:00 650 mg via RECTAL
  Filled 2018-10-13: qty 1

## 2018-10-13 MED ORDER — SENNOSIDES-DOCUSATE SODIUM 8.6-50 MG PO TABS
2.0000 | ORAL_TABLET | ORAL | Status: DC
  Administered 2018-10-14: 13:00:00 2 via ORAL
  Filled 2018-10-13: qty 2

## 2018-10-13 MED ORDER — PRENATAL MULTIVITAMIN CH
1.0000 | ORAL_TABLET | Freq: Every day | ORAL | Status: DC
  Administered 2018-10-14: 1 via ORAL
  Filled 2018-10-13: qty 1

## 2018-10-13 MED ORDER — GABAPENTIN 100 MG PO CAPS
100.0000 mg | ORAL_CAPSULE | Freq: Three times a day (TID) | ORAL | Status: DC
  Administered 2018-10-13 – 2018-10-15 (×6): 100 mg via ORAL
  Filled 2018-10-13 (×6): qty 1

## 2018-10-13 MED ORDER — EPHEDRINE SULFATE 50 MG/ML IJ SOLN
INTRAMUSCULAR | Status: AC
  Filled 2018-10-13: qty 1

## 2018-10-13 MED ORDER — MORPHINE SULFATE (PF) 0.5 MG/ML IJ SOLN
INTRAMUSCULAR | Status: DC | PRN
  Administered 2018-10-13: .15 mg via INTRATHECAL

## 2018-10-13 MED ORDER — ACETAMINOPHEN 500 MG PO TABS
1000.0000 mg | ORAL_TABLET | Freq: Four times a day (QID) | ORAL | Status: AC
  Administered 2018-10-14: 1000 mg via ORAL
  Filled 2018-10-13 (×4): qty 2

## 2018-10-13 MED ORDER — MENTHOL 3 MG MT LOZG: 1.0000 | LOZENGE | OROMUCOSAL | Status: DC | PRN

## 2018-10-13 MED ORDER — MORPHINE SULFATE (PF) 0.5 MG/ML IJ SOLN
INTRAMUSCULAR | Status: AC
  Filled 2018-10-13: qty 10

## 2018-10-13 MED ORDER — LACTATED RINGERS IV SOLN: Freq: Once | INTRAVENOUS | Status: DC

## 2018-10-13 MED ORDER — ONDANSETRON HCL 4 MG/2ML IJ SOLN: 4.0000 mg | Freq: Three times a day (TID) | INTRAMUSCULAR | Status: DC | PRN

## 2018-10-13 MED ORDER — GENTAMICIN SULFATE 40 MG/ML IJ SOLN
5.0000 mg/kg | INTRAVENOUS | Status: AC
  Administered 2018-10-13: 340 mg via INTRAVENOUS
  Filled 2018-10-13: qty 8.5

## 2018-10-13 MED ORDER — OXYTOCIN 40 UNITS IN NORMAL SALINE INFUSION - SIMPLE MED
INTRAVENOUS | Status: DC | PRN
  Administered 2018-10-13: 1000 mL via INTRAVENOUS

## 2018-10-13 SURGICAL SUPPLY — 34 items
CANISTER SUCT 3000ML PPV (MISCELLANEOUS) ×3 IMPLANT
CLOSURE WOUND 1/2 X4 (GAUZE/BANDAGES/DRESSINGS) ×1
COVER WAND RF STERILE (DRAPES) ×3 IMPLANT
DERMABOND ADVANCED (GAUZE/BANDAGES/DRESSINGS) ×2
DERMABOND ADVANCED .7 DNX12 (GAUZE/BANDAGES/DRESSINGS) ×1 IMPLANT
DRSG TELFA 3X8 NADH (GAUZE/BANDAGES/DRESSINGS) ×3 IMPLANT
ELECT CAUTERY BLADE 6.4 (BLADE) ×1 IMPLANT
ELECT REM PT RETURN 9FT ADLT (ELECTROSURGICAL) ×3
ELECTRODE REM PT RTRN 9FT ADLT (ELECTROSURGICAL) ×1 IMPLANT
GAUZE SPONGE 4X4 12PLY STRL (GAUZE/BANDAGES/DRESSINGS) ×3 IMPLANT
GLOVE PI ORTHOPRO 6.5 (GLOVE) ×2
GLOVE PI ORTHOPRO STRL 6.5 (GLOVE) ×1 IMPLANT
GLOVE SURG SYN 6.5 ES PF (GLOVE) ×3 IMPLANT
GLOVE SURG SYN 6.5 PF PI (GLOVE) ×1 IMPLANT
GOWN STRL REUS W/ TWL LRG LVL3 (GOWN DISPOSABLE) ×3 IMPLANT
GOWN STRL REUS W/TWL LRG LVL3 (GOWN DISPOSABLE) ×6
NS IRRIG 1000ML POUR BTL (IV SOLUTION) ×3 IMPLANT
PACK C SECTION AR (MISCELLANEOUS) ×3 IMPLANT
PAD DRESSING TELFA 3X8 NADH (GAUZE/BANDAGES/DRESSINGS) ×1 IMPLANT
PAD OB MATERNITY 4.3X12.25 (PERSONAL CARE ITEMS) ×3 IMPLANT
PAD PREP 24X41 OB/GYN DISP (PERSONAL CARE ITEMS) ×3 IMPLANT
PENCIL SMOKE ULTRAEVAC 22 CON (MISCELLANEOUS) ×3 IMPLANT
STRAP SAFETY 5IN WIDE (MISCELLANEOUS) ×3 IMPLANT
STRIP CLOSURE SKIN 1/2X4 (GAUZE/BANDAGES/DRESSINGS) ×2 IMPLANT
SUT MNCRL 4-0 (SUTURE) ×2
SUT MNCRL 4-0 27XMFL (SUTURE) ×1
SUT PDS AB 1 TP1 96 (SUTURE) ×3 IMPLANT
SUT VIC AB 0 CT1 36 (SUTURE) ×6 IMPLANT
SUT VIC AB 2-0 CT1 27 (SUTURE) ×2
SUT VIC AB 2-0 CT1 TAPERPNT 27 (SUTURE) ×1 IMPLANT
SUT VIC AB 3-0 SH 27 (SUTURE) ×2
SUT VIC AB 3-0 SH 27X BRD (SUTURE) ×1 IMPLANT
SUTURE MNCRL 4-0 27XMF (SUTURE) ×1 IMPLANT
SWABSTK COMLB BENZOIN TINCTURE (MISCELLANEOUS) ×3 IMPLANT

## 2018-10-13 NOTE — Anesthesia Procedure Notes (Signed)
Spinal  Patient location during procedure: OB Staffing Performed: anesthesiologist  Preanesthetic Checklist Completed: patient identified, site marked, surgical consent, pre-op evaluation, timeout performed, IV checked, risks and benefits discussed and monitors and equipment checked Spinal Block Patient position: sitting Prep: DuraPrep Patient monitoring: heart rate, cardiac monitor, continuous pulse ox and blood pressure Approach: midline Location: L3-4 Injection technique: single-shot Needle Needle type: Sprotte and Whitacre  Needle gauge: 24 G Needle length: 9 cm Assessment Sensory level: T4

## 2018-10-13 NOTE — Progress Notes (Signed)
   10/13/18 1300  Clinical Encounter Type  Visited With Patient and family together  Visit Type Initial  Referral From Nurse  Consult/Referral To Chaplain  Spiritual Encounters  Spiritual Needs Other (Comment)  Chaplain received OR for AD and visited patient. Husband was at bedside. Chaplain introduced herself and and congratulated her on her new baby boy. Chaplain asked patient if she was interesting in completing a AD and patient seemed not to know what Chaplain was talking about. Chaplain educated patient and patient stated she has one of those already. Chaplain ask was there anything else she could do, patient said no thanks. Chaplain gave well wishes.

## 2018-10-13 NOTE — Anesthesia Preprocedure Evaluation (Signed)
Anesthesia Evaluation  Patient identified by MRN, date of birth, ID band Patient awake    Reviewed: Allergy & Precautions, H&P , NPO status , Patient's Chart, lab work & pertinent test results, reviewed documented beta blocker date and time   History of Anesthesia Complications (+) PONV, Family history of anesthesia reaction and history of anesthetic complications  Airway Mallampati: II   Neck ROM: full    Dental  (+) Poor Dentition   Pulmonary neg pulmonary ROS,    Pulmonary exam normal        Cardiovascular Exercise Tolerance: Good negative cardio ROS Normal cardiovascular exam Rhythm:regular Rate:Normal     Neuro/Psych negative neurological ROS  negative psych ROS   GI/Hepatic Neg liver ROS, GERD  ,  Endo/Other  negative endocrine ROSdiabetes, Gestational  Renal/GU negative Renal ROS  negative genitourinary   Musculoskeletal   Abdominal   Peds  Hematology negative hematology ROS (+)   Anesthesia Other Findings Past Medical History: No date: Complication of anesthesia     Comment:  HARD TO WAKE UP FROM WISDOM TEETH No date: Family history of adverse reaction to anesthesia     Comment:  MOM- No date: GERD (gastroesophageal reflux disease) No date: Gestational diabetes No date: PONV (postoperative nausea and vomiting)     Comment:  WITH PREVIOUS C-SECTION No date: Vaginal Pap smear, abnormal     Comment:  2009 Past Surgical History: No date: CESAREAN SECTION 2014: HYSTEROSCOPY; Bilateral No date: MOUTH SURGERY     Comment:  TEENAGER No date: WISDOM TOOTH EXTRACTION; Bilateral BMI    Body Mass Index: 33.47 kg/m     Reproductive/Obstetrics negative OB ROS                             Anesthesia Physical Anesthesia Plan  ASA: III  Anesthesia Plan: General   Post-op Pain Management:    Induction:   PONV Risk Score and Plan:   Airway Management Planned:   Additional  Equipment:   Intra-op Plan:   Post-operative Plan:   Informed Consent: I have reviewed the patients History and Physical, chart, labs and discussed the procedure including the risks, benefits and alternatives for the proposed anesthesia with the patient or authorized representative who has indicated his/her understanding and acceptance.     Dental Advisory Given  Plan Discussed with: CRNA  Anesthesia Plan Comments:         Anesthesia Quick Evaluation

## 2018-10-13 NOTE — Transfer of Care (Signed)
Immediate Anesthesia Transfer of Care Note  Patient: Rebekah Cook  Procedure(s) Performed: CESAREAN SECTION, REPEAT (N/A )  Patient Location: Mother/Baby  Anesthesia Type:Spinal  Level of Consciousness: awake, alert , oriented and patient cooperative  Airway & Oxygen Therapy: Patient Spontanous Breathing  Post-op Assessment: Report given to RN and Post -op Vital signs reviewed and stable  Post vital signs: Reviewed and stable  Last Vitals:  Vitals Value Taken Time  BP    Temp    Pulse 109 10/13/18 0931  Resp 13 10/13/18 0931  SpO2 100 % 10/13/18 0931  Vitals shown include unvalidated device data.  Last Pain:  Vitals:   10/13/18 0613  TempSrc: Oral  PainSc:          Complications: No apparent anesthesia complications

## 2018-10-13 NOTE — Op Note (Signed)
Cesarean Section Procedure Note  10/13/2018   Patient:  Rebekah Cook  38 y.o. female at [redacted]w[redacted]d.  Patient's last menstrual period was 01/11/2018 (approximate). Preoperative diagnosis:  previous c-section, gestational diabetes, obesity, term pregnancy Postoperative diagnosis:  previous c-section, gestational diabetes, obesity, term pregnancy, live female infant.  PROCEDURE:  Procedure(s): CESAREAN SECTION, REPEAT (N/A) Surgeon:  Surgeon(s) and Role:    * Wyatt Thorstenson, Honor Loh, MD - Primary Hassan Buckler - CNM - Assisting Anesthesia:  spinal I/O: Total I/O In: 600 [I.V.:600] Out: 350 [Urine:100; Blood:250] Specimens:  Cord Blood, placenta Complications: None Apparent Disposition:  VS stable to PACU  Findings: normal top-sized uterus, tubes and ovaries bilaterally Live born female, "[name]" Birth Weight: 9 lb 8.4 oz (4320 g) APGAR: 57, 9  Newborn Delivery   Birth date/time: 10/13/2018 08:29:00 Delivery type: C-Section, Low Transverse Trial of labor: No C-section categorization: Repeat       Indication for procedure: 38 y.o. female at [redacted]w[redacted]d with previous cesarean, diet-controlled gestational diabetes, and desire for repeat cesarean at term.  Procedure Details   The risks, benefits, complications, treatment options, and expected outcomes were discussed with the patient. Informed consent was obtained. The patient was taken to Operating Room, identified as Rebekah Cook and the procedure verified as a cesarean delivery.   After administration of anesthesia, the patient was prepped and draped in the usual sterile manner, including a vaginal prep. A surgical time out was performed, with the pediatric team present. After confirming adequate anesthesia, a Pfannenstiel incision was made and carried down through the subcutaneous tissue to the fascia. Fascial incision was made and extended transversely. The fascia was separated from the underlying rectus tissue superiorly and inferiorly. The  rectus muscles were divided in the midline. The peritoneum was identified and entered. Peritoneal incision was extended longitudinally.  A low transverse uterine incision was made. While "As Long As You Love Me" by the Backstreet Boys was being played, delivered from cephalic presentation was a live born female. Delayed cord clamping was performed for 60 seconds during which time we sang Happy Birthday to [name]. The umbilical cord was doubly clamped and cut, and the baby was handed off to the awaitng pediatrician.  Cord blood was obtained for evaluation. The placenta was removed intact and appeared normal. The uterus was delivered from the abdominal cavity and cleared of clots, membranes, and debris. The uterus, tubes and ovaries appeared normal. The uterine incision was closed with running locking sutures of 0 Vicryl, and then a second, imbricating stitch was placed. Hemostasis was observed. The abdominal cavity was evacuated of extraneous fluid. The uterus was returned to the abdominal cavity and again the incision was inspected for hemostasis, which was confirmed.  The paracolic gutters were cleaned. The fascia was then reapproximated with running suture of vicryl. 90cc of Long- and short-acting bupivicaine was injected circumferentially into the fascia.  After a change of gloves, the subcutaneous tissue was irrigated and reapproximated with 3-0 vicryl. The skin was closed with 4-0 Monocryl and 10cc of long- and short-acting bupivacaine injected into the skin and subcutaneous tissues.  The incision was covered with surgical glue. An abdominal binder was placed.   Instrument, sponge, and needle counts were correct prior the abdominal closure and at the conclusion of the case.   I was present and performed this procedure in its entirety.  VTE: SCDs Perioperative antibiotics: Clindamycin and Gentamicin  ----- Larey Days, MD Attending Obstetrician and Gynecologist [facility], Department of  [facility]

## 2018-10-13 NOTE — H&P (Signed)
OB History & Physical   History of Present Illness:  Chief Complaint:   HPI:  Rebekah Cook is a 38 y.o. Rebekah Cook female at 3748w2d dated by embryo transfer on 01/30/18/LMP.   Patient's last menstrual period was 01/11/2018 (approximate). Estimated Date of Delivery: 10/18/18  She presents to L&D for scheduled repeat cesarean at term  +FM, no CTX, no LOF, no VB  Pregnancy Issues: 1. IVF 2. AMA 3. Obesity (prepreg 30.5) 4. Prior cesarean 5. GDMA1   Maternal Medical History:   Past Medical History:  Diagnosis Date  . Complication of anesthesia    HARD TO WAKE UP FROM WISDOM TEETH  . Family history of adverse reaction to anesthesia    MOM-  . GERD (gastroesophageal reflux disease)   . Gestational diabetes   . PONV (postoperative nausea and vomiting)    WITH PREVIOUS C-SECTION  . Vaginal Pap smear, abnormal    2009    Past Surgical History:  Procedure Laterality Date  . CESAREAN SECTION    . HYSTEROSCOPY Bilateral 2014  . MOUTH SURGERY     TEENAGER  . WISDOM TOOTH EXTRACTION Bilateral     Allergies  Allergen Reactions  . Penicillins Swelling    Swelling of gums Did it involve swelling of the face/tongue/throat, SOB, or low BP? No Did it involve sudden or severe rash/hives, skin peeling, or any reaction on the inside of your mouth or nose? No Did you need to seek medical attention at a hospital or doctor's office? No When did it last happen?2009 If all above answers are "NO", may proceed with cephalosporin use.   . Levofloxacin Nausea And Vomiting    Prior to Admission medications   Medication Sig Start Date End Date Taking? Authorizing Provider  famotidine (PEPCID) 20 MG tablet Take 1 tablet by mouth daily as needed (heart burn).    Yes [provider]  Polyethylene Glycol 3350 (PEG 3350) 17 g PACK Take 17 g by mouth daily as needed (constipation).    Yes [provider]  Prenatal Vit-Fe Fumarate-FA (PRENATAL MULTIVITAMIN) TABS tablet  Take 1 tablet by mouth daily at 12 noon.   Yes [provider]  Phenylephrine-Cocoa Butter (PREPARATION H RE) Place 1 Dose rectally as needed.     [provider]     [facility]   Social History: She  reports that she has never smoked. She has never used smokeless tobacco. She reports previous alcohol use. She reports that she does not use drugs.  Family History: family history includes Alzheimer's disease in her paternal grandfather; Arthritis in her maternal grandfather and maternal grandmother; Cancer in her father, maternal grandfather, maternal grandmother, mother, and paternal uncle; Diabetes in her maternal grandmother and paternal grandmother; Heart disease in her father; Kidney disease in her mother; Miscarriages / Stillbirths in her mother; Parkinson's disease in her maternal aunt and paternal grandfather; Stroke in her maternal grandfather and paternal grandmother.   Review of Systems: A full review of systems was performed and negative except as noted in the HPI.     Physical Exam:  Vital Signs: BP 124/81 (BP Location: Left Arm)   Pulse 91   Temp 97.8 F (36.6 C) (Oral)   Resp 18   Ht 5\' 4"  (1.626 m)   Wt 88.5 kg   LMP 01/11/2018 (Approximate)   BMI 33.47 kg/m  General: no acute distress.  HEENT: normocephalic, atraumatic Heart: regular rate & rhythm.  No murmurs/rubs/gallops Lungs: clear to auscultation  bilaterally, normal respiratory effort Abdomen: soft, gravid, non-tender;  EFW: 8lb2oz Pelvic:   External: Normal external female genitalia     Extremities: non-tender, symmetric, 1+ edema bilaterally.  DTRs: 2+  Neurologic: Alert & oriented x 3.    Results for orders placed or performed during the hospital encounter of 10/13/18 (from the past 24 hour(s))  CBC     Status: Abnormal   Collection Time: 10/13/18  5:55 AM  Result Value Ref Range   WBC 11.0 (H) 4.0 - 10.5 K/uL   RBC 4.41 3.87 - 5.11 MIL/uL   Hemoglobin  11.5 (L) 12.0 - 15.0 g/dL   HCT 36.2 36.0 - 46.0 %   MCV 82.1 80.0 - 100.0 fL   MCH 26.1 26.0 - 34.0 pg   MCHC 31.8 30.0 - 36.0 g/dL   RDW 14.6 11.5 - 15.5 %   Platelets 190 150 - 400 K/uL   nRBC 0.0 0.0 - 0.2 %  Type and screen [place]     Status: None   Collection Time: 10/13/18  5:55 AM  Result Value Ref Range   ABO/RH(D) O POS    Antibody Screen NEG    Sample Expiration      10/16/2018,2359 Performed at [facility], [address] [place], [place] [address]   ABO/Rh     Status: None   Collection Time: 10/13/18  6:19 AM  Result Value Ref Range   ABO/RH(D)      O POS Performed at [facility], [address], [place], [place] [address]     Pertinent Results:  Prenatal Labs: Blood type/Rh O pos  Antibody screen neg  Rubella Non-Immune  Varicella Immune  RPR NR  HBsAg Neg  HIV NR  GC neg  Chlamydia neg  Genetic screening Not done  1 hour GTT --  3 hour GTT [phone]  GBS neg   FHT: 140 mod + accels no decels TOCO:  occasional   Cephalic by leopolds  No results found.  Assessment:  Rebekah Cook is a 38 y.o. G2P1001 female at [redacted]w[redacted]d with previous cesarean, for scheduled repeat.   Plan:  1. Admit to Labor & Delivery 2. CBC, T&S, NPO, IVF 3. GBS neg 4. Consents obtained. 5. Continuous efm/toco 6. To OR when ready  ----- Rebekah Days, MD Attending Obstetrician and Gynecologist [facility], Department of [facility]

## 2018-10-13 NOTE — Anesthesia Post-op Follow-up Note (Signed)
Anesthesia QCDR form completed.        

## 2018-10-13 NOTE — Discharge Summary (Signed)
Obstetrical Discharge Summary  Patient Name: Rebekah Cook DOB: [birth year] MRN: [id number]  Date of Admission: 10/13/2018 Date of Delivery: 10/13/2018 Delivered by: Larey Days, MD Date of Discharge: 10/15/2018  Primary OB: [place]   MOQ:HUTMLYY'T last menstrual period was 01/11/2018 (approximate). EDC Estimated Date of Delivery: 10/18/18 Gestational Age at Delivery: [redacted]w[redacted]d  Antepartum complications:  1. IVF 2. AMA 3. Obesity (prepreg 30.5) 4. Prior cesarean 5. GDMA1   Admitting Diagnosis: scheduled repeat cesarean at term  Secondary Diagnosis: Patient Active Problem List   Diagnosis Date Noted  . Labor and delivery indication for care or intervention 10/13/2018  . History of cesarean delivery, currently pregnant 10/13/2018  . Obesity (BMI 30-39.9) 10/13/2018  . Advanced maternal age in multigravida 10/13/2018  . Gestational diabetes mellitus (GDM) 10/13/2018  . Pregnancy resulting from assisted reproductive technology 10/13/2018    Augmentation: n/a Complications: None Intrapartum complications/course: none- routine scheduled repeat cesarean Delivery Type: repeat cesarean section, low transverse incision Anesthesia: spinal Placenta: expressed Laceration: n/a Episiotomy: n/a Newborn Data: Live born female "GHaze Boyden Birth Weight: 9 lb 8.4 oz (4320 g) APGAR: 9, 9  Newborn Delivery   Birth date/time: 10/13/2018 08:29:00 Delivery type: C-Section, Low Transverse Trial of labor: No C-section categorization: Repeat      Postpartum Procedures: MMR  Post partum course:  Patient had an uncomplicated postpartum course.  By time of discharge on POD#2, her pain was controlled on oral pain medications; she had appropriate lochia and was ambulating, voiding without difficulty, tolerating regular diet and passing flatus.   She was deemed stable for discharge to home.    Discharge Physical Exam:  BP 123/86 (BP Location: Right Arm)   Pulse 92   Temp 98.1 F (36.7  C)   Resp 18   Ht '5\' 4"'$  (1.626 m)   Wt 88.5 kg   LMP 01/11/2018 (Approximate)   SpO2 98%   Breastfeeding Unknown   BMI 33.47 kg/m   General: NAD CV: RRR Pulm: CTABL, nl effort ABD: s/nd/nt, fundus firm and below the umbilicus Lochia: moderate Incision: c/d/I DVT Evaluation: LE non-ttp, no evidence of DVT on exam.  Hemoglobin  Date Value Ref Range Status  10/14/2018 9.6 (L) 12.0 - 15.0 g/dL Final   HCT  Date Value Ref Range Status  10/14/2018 30.4 (L) 36.0 - 46.0 % Final     Disposition: stable, discharge to home. Baby Feeding: breastmilk Baby Disposition: home with mom  Rh Immune globulin given: n/a Rubella vaccine given: 10/15/18 Flu vaccine given in AP or PP setting: AP  Contraception: infertility  Prenatal Labs:  Blood type/Rh O pos  Antibody screen neg  Rubella Non-Immune  Varicella Immune  RPR NR  HBsAg Neg  HIV NR  GC neg  Chlamydia neg  Genetic screening Not done  1 hour GTT --  3 hour GTT [phone] GBS neg      Plan:  LVonzella NippleStrover was discharged to home in good condition. Follow-up appointment with Dr. WLeonides Schanzin 2 weeks.  Discharge Medications: Allergies as of 10/15/2018      Reactions   Penicillins Swelling   Swelling of gums Did it involve swelling of the face/tongue/throat, SOB, or low BP? No Did it involve sudden or severe rash/hives, skin peeling, or any reaction on the inside of your mouth or nose? No Did you need to seek medical attention at a hospital or doctor's office? No When did it last happen?2009 If all above answers are "NO", may proceed with cephalosporin use.  Levofloxacin Nausea And Vomiting      Medication List    TAKE these medications   acetaminophen 500 MG tablet Commonly known as: Acetaminophen Extra Strength Take 2 tablets (1,000 mg total) by mouth every 6 (six) hours for 3 days.   docusate sodium 100 MG capsule Commonly known as: COLACE Take 1 capsule (100 mg total) by mouth 2 (two) times  daily. To keep stools soft   ibuprofen 800 MG tablet Commonly known as: ADVIL Take 1 tablet (800 mg total) by mouth every 8 (eight) hours for 3 days. After 72hrs, may take PRN as needed   oxycodone 5 MG capsule Commonly known as: OXY-IR Take 1 capsule (5 mg total) by mouth every 6 (six) hours as needed for pain.   PEG 3350 17 g Pack Take 17 g by mouth daily as needed (constipation).   Pepcid 20 MG tablet Generic drug: famotidine Take 1 tablet by mouth daily as needed (heart burn).   prenatal multivitamin Tabs tablet Take 1 tablet by mouth daily at 12 noon.   PREPARATION H RE Place 1 Dose rectally as needed.       Follow-up Information    Ward, Honor Loh, MD Follow up in 2 week(s).   Specialty: Obstetrics and Gynecology Contact information: [place] [address] [phone]           Signed: Benjaman Kindler 10/15/18

## 2018-10-14 LAB — CBC
HCT: 30.4 % — ABNORMAL LOW (ref 36.0–46.0)
Hemoglobin: 9.6 g/dL — ABNORMAL LOW (ref 12.0–15.0)
MCH: 26.4 pg (ref 26.0–34.0)
MCHC: 31.6 g/dL (ref 30.0–36.0)
MCV: 83.7 fL (ref 80.0–100.0)
Platelets: 157 10*3/uL (ref 150–400)
RBC: 3.63 MIL/uL — ABNORMAL LOW (ref 3.87–5.11)
RDW: 15 % (ref 11.5–15.5)
WBC: 10.7 10*3/uL — ABNORMAL HIGH (ref 4.0–10.5)
nRBC: 0 % (ref 0.0–0.2)

## 2018-10-14 LAB — RPR: RPR Ser Ql: NONREACTIVE

## 2018-10-14 NOTE — Anesthesia Postprocedure Evaluation (Signed)
Anesthesia Post Note  Patient: Rebekah Cook  Procedure(s) Performed: CESAREAN SECTION, REPEAT (N/A )  Patient location during evaluation: Mother Baby Anesthesia Type: General and Spinal Level of consciousness: awake and alert and oriented Pain management: pain level controlled Vital Signs Assessment: post-procedure vital signs reviewed and stable Respiratory status: respiratory function stable Cardiovascular status: stable Postop Assessment: no headache, no backache, patient able to bend at knees, no apparent nausea or vomiting, able to ambulate and adequate PO intake Anesthetic complications: no     Last Vitals:  Vitals:   10/14/18 0500 10/14/18 0733  BP:  108/78  Pulse: 93 85  Resp:  18  Temp:  36.9 C  SpO2: 93% 97%    Last Pain:  Vitals:   10/14/18 0733  TempSrc: Oral  PainSc:                  [name]

## 2018-10-14 NOTE — Lactation Note (Signed)
This note was copied from a baby's chart. Lactation Consultation Note  Patient Name: Rebekah Cook Today's Date: 10/14/2018     Maternal Data    Feeding    LATCH Score                   Interventions    Lactation Tools Discussed/Used     Consult Status  Lactation spoke to mother about feeding plans for infant. Mother states that she would like to exclusively pump and give infant a bottle. Mother has been pumping every 2-3 hours for 15 minutes. Infant has also been supplemented with formula. Mother has sore nipples and has bruises to both nipples from past latch attempts. She was given coconut oil and comfort gels. Mother is currently using size 27 mm flanges but will need size 30 mm before going home.    Rebekah Cook 07/17/175, 1:00 PM

## 2018-10-14 NOTE — Plan of Care (Signed)
Vs stable; up with assistance; taking toradol, tylenol and oxycodone for pain control; tolerating regular diet

## 2018-10-14 NOTE — Progress Notes (Signed)
Post Partum Day 1 Subjective: Doing well, no complaints.  Tolerating regular diet, pain with PO meds, has not voided yet, but has been OOB.  No CP SOB Fever,Chills, N/V or leg pain; denies nipple or breast pain; no HA change of vision, RUQ/epigastric pain  Objective: BP 108/78 (BP Location: Left Arm)   Pulse 85   Temp 98.4 F (36.9 C) (Oral)   Resp 18   Ht 5\' 4"  (1.626 m)   Wt 88.5 kg   LMP 01/11/2018 (Approximate)   SpO2 97%   BMI 33.47 kg/m    Physical Exam:  General: NAD Breasts: soft/nontender CV: RRR Pulm: nl effort, CTABL Abdomen: soft, NT, BS x 4 Incision: no erythema or drainage Lochia: small Uterine Fundus: fundus firm and 1 fb below umbilicus DVT Evaluation: no cords, ttp LEs   Recent Labs    10/13/18 0555 10/14/18 0556  HGB 11.5* 9.6*  HCT 36.2 30.4*  WBC 11.0* 10.7*  PLT 190 157    Assessment/Plan: 38 y.o. G2P2002 postpartum day # 1  - Continue routine PP care - Lactation consult prn.  - hx IVF/infertility - Acute blood loss anemia - hemodynamically stable and asymptomatic; start po ferrous sulfate BID with stool softeners  - Immunization status:  all Imms up to date  Disposition: Does not desire Dc home today.     Francetta Found, CNM 10/14/2018  8:34 AM

## 2018-10-15 MED ORDER — OXYCODONE HCL 5 MG PO CAPS: 5.0000 mg | ORAL_CAPSULE | Freq: Four times a day (QID) | ORAL | 0 refills | Status: DC | PRN

## 2018-10-15 MED ORDER — IBUPROFEN 800 MG PO TABS: 800.0000 mg | ORAL_TABLET | Freq: Three times a day (TID) | ORAL | 1 refills | Status: AC

## 2018-10-15 MED ORDER — DOCUSATE SODIUM 100 MG PO CAPS: 100.0000 mg | ORAL_CAPSULE | Freq: Two times a day (BID) | ORAL | 0 refills | Status: DC

## 2018-10-15 MED ORDER — ACETAMINOPHEN 500 MG PO TABS: 1000.0000 mg | ORAL_TABLET | Freq: Four times a day (QID) | ORAL | 0 refills | Status: AC

## 2018-10-15 NOTE — Progress Notes (Signed)
DC instructions given to pt and FOB.  Pt verb u/o of f/u care

## 2018-10-15 NOTE — Plan of Care (Signed)
Vs stable; up ad lib; tolerating regular diet; taking tylenol, motrin and oxycodone for pain control; probable discharge today

## 2018-10-15 NOTE — Discharge Instructions (Signed)
Discharge instructions:   Call office if you have any of the following: headache, visual changes, fever >101.0 F, chills, shortness of breath, breast concerns, excessive vaginal bleeding, incision drainage or problems, leg pain or redness, depression or any other concerns.   Activity: Do not lift > 10 lbs for 6 weeks.  No intercourse or tampons for 6 weeks.  No driving for 1-2 weeks.   Call your doctor for increased pain or vaginal bleeding, temperature above 101.0, depression, or concerns.  No strenuous activity or heavy lifting for 6 weeks.  No intercourse, tampons, douching, or enemas for 6 weeks.  No tub baths-showers only.  No driving for 2 weeks or while taking pain medications.  Continue prenatal vitamin and iron.  Increase calories and fluids while breastfeeding.  You may have a slight fever when your milk comes in, but it should go away on its own.  If it does not, and rises above 101.0 please call the doctor.  For concerns about your baby, please call your pediatrician For breastfeeding concerns, the lactation consultant can be reached at [phone]   Cesarean Delivery, Care After This sheet gives you information about how to care for yourself after your procedure. Your health care provider may also give you more specific instructions. If you have problems or questions, contact your health care provider. What can I expect after the procedure? After the procedure, it is common to have:  A small amount of blood or clear fluid coming from the incision.  Some redness, swelling, and pain in your incision area.  Some abdominal pain and soreness.  Vaginal bleeding (lochia). Even though you did not have a vaginal delivery, you will still have vaginal bleeding and discharge.  Pelvic cramps.  Fatigue. You may have pain, swelling, and discomfort in the tissue between your vagina and your anus (perineum) if:  Your C-section was unplanned, and you were allowed to labor and  push.  An incision was made in the area (episiotomy) or the tissue tore during attempted vaginal delivery. Follow these instructions at home: Incision care   Follow instructions from your health care provider about how to take care of your incision. Make sure you: ? Wash your hands with soap and water before you change your bandage (dressing). If soap and water are not available, use hand sanitizer. ? If you have a dressing, change it or remove it as told by your health care provider. ? Leave stitches (sutures), skin staples, skin glue, or adhesive strips in place. These skin closures may need to stay in place for 2 weeks or longer. If adhesive strip edges start to loosen and curl up, you may trim the loose edges. Do not remove adhesive strips completely unless your health care provider tells you to do that.  Check your incision area every day for signs of infection. Check for: ? More redness, swelling, or pain. ? More fluid or blood. ? Warmth. ? Pus or a bad smell.  Do not take baths, swim, or use a hot tub until your health care provider says it's okay. Ask your health care provider if you can take showers.  When you cough or sneeze, hug a pillow. This helps with pain and decreases the chance of your incision opening up (dehiscing). Do this until your incision heals. Medicines  Take over-the-counter and prescription medicines only as told by your health care provider.  If you were prescribed an antibiotic medicine, take it as told by your health care provider. Do not  stop taking the antibiotic even if you start to feel better.  Do not drive or use heavy machinery while taking prescription pain medicine. Lifestyle  Do not drink alcohol. This is especially important if you are breastfeeding or taking pain medicine.  Do not use any products that contain nicotine or tobacco, such as cigarettes, e-cigarettes, and chewing tobacco. If you need help quitting, ask your health care  provider. Eating and drinking  Drink at least 8 eight-ounce glasses of water every day unless told not to by your health care provider. If you breastfeed, you may need to drink even more water.  Eat high-fiber foods every day. These foods may help prevent or relieve constipation. High-fiber foods include: ? Whole grain cereals and breads. ? Brown rice. ? Beans. ? Fresh fruits and vegetables. Activity   If possible, have someone help you care for your baby and help with household activities for at least a few days after you leave the hospital.  Return to your normal activities as told by your health care provider. Ask your health care provider what activities are safe for you.  Rest as much as possible. Try to rest or take a nap while your baby is sleeping.  Do not lift anything that is heavier than 10 lbs (4.5 kg), or the limit that you were told, until your health care provider says that it is safe.  Talk with your health care provider about when you can engage in sexual activity. This may depend on your: ? Risk of infection. ? How fast you heal. ? Comfort and desire to engage in sexual activity. General instructions  Do not use tampons or douches until your health care provider approves.  Wear loose, comfortable clothing and a supportive and well-fitting bra.  Keep your perineum clean and dry. Wipe from front to back when you use the toilet.  If you pass a blood clot, save it and call your health care provider to discuss. Do not flush blood clots down the toilet before you get instructions from your health care provider.  Keep all follow-up visits for you and your baby as told by your health care provider. This is important. Contact a health care provider if:  You have: ? A fever. ? Bad-smelling vaginal discharge. ? Pus or a bad smell coming from your incision. ? Difficulty or pain when urinating. ? A sudden increase or decrease in the frequency of your bowel  movements. ? More redness, swelling, or pain around your incision. ? More fluid or blood coming from your incision. ? A rash. ? Nausea. ? Little or no interest in activities you used to enjoy. ? Questions about caring for yourself or your baby.  Your incision feels warm to the touch.  Your breasts turn red or become painful or hard.  You feel unusually sad or worried.  You vomit.  You pass a blood clot from your vagina.  You urinate more than usual.  You are dizzy or light-headed. Get help right away if:  You have: ? Pain that does not go away or get better with medicine. ? Chest pain. ? Difficulty breathing. ? Blurred vision or spots in your vision. ? Thoughts about hurting yourself or your baby. ? New pain in your abdomen or in one of your legs. ? A severe headache.  You faint.  You bleed from your vagina so much that you fill more than one sanitary pad in one hour. Bleeding should not be heavier than your heaviest  period. Summary  After the procedure, it is common to have pain at your incision site, abdominal cramping, and slight bleeding from your vagina.  Check your incision area every day for signs of infection.  Tell your health care provider about any unusual symptoms.  Keep all follow-up visits for you and your baby as told by your health care provider. This information is not intended to replace advice given to you by your health care provider. Make sure you discuss any questions you have with your health care provider. Document Released: 11/18/2001 Document Revised: 09/04/2017 Document Reviewed: 09/04/2017 Elsevier Patient Education  2020 [employer] Inc[employer]   Breast Pumping Tips Breast pumping is a way to get milk out of your breasts. You will then store the milk for your baby to use when you are away from home. There are three ways to pump. You can:  Use your hand to massage and squeeze your breast (hand expression).  Use a hand-held machine to manually  pump your milk.  Use an electric machine to pump your milk. In the beginning you may not get much milk. After a few days your breasts should make more. Pumping can help you start making milk after your baby is born. Pumping helps you to keep making milk when you are away from your baby. When should I pump? You can start pumping soon after your baby is born. Follow these tips:  When you are with your baby: ? Pump after you breastfeed. ? Pump from the free breast while you breastfeed.  When you are away from your baby: ? Pump every 2-3 hours for 15 minutes. ? Pump both breasts at the same time if you can.  If your baby drinks formula, pump around the time your baby gets the formula.  If you drank alcohol, wait 2 hours before you pump.  If you are going to have surgery, ask your doctor when you should pump again. How do I get ready to pump? Take steps to relax. Try these things to help your milk come in:  Smell your baby's blanket or clothes.  Look at a picture or video of your baby.  Sit in a quiet, private space.  Massage your breast and nipple.  Place a cloth on your breast. The cloth should be warm and a little wet.  Play relaxing music.  Picture your milk flowing. What are some tips? General tips for pumping breast milk   Always wash your hands before pumping.  If you do not get much milk or if pumping hurts, try different pump settings or a different kind of pump.  Drink enough fluid so your pee (urine) is clear or pale yellow.  Wear clothing that opens in the front or is easy to take off.  Pump milk into a clean bottle or container.  Do not use anything that has nicotine or tobacco. Examples are cigarettes and e-cigarettes. If you need help quitting, ask your doctor. Tips for storing breast milk   Store breast milk in a clean, BPA-free container. These include: ? A glass or plastic bottle. ? A milk storage bag.  Store only 2-4 ounces of breast milk in  each container.  Swirl the breast milk in the container. Do not shake it.  Write down the date you pumped the milk on the container.  This is how long you can store breast milk: ? Room temperature: 6-8 hours. It is best to use the milk within 4 hours. ? Cooler with ice packs:  24 hours. ? Refrigerator: 5-8 days, if the milk is clean. It is best to use the milk within 3 days. ? Freezer: 9-12 months, if the milk is clean and stored away from the freezer door. It is best to use the milk within 6 months.  Put milk in the back of the refrigerator or freezer.  Thaw frozen milk using warm water. Do not use the microwave. Tips for choosing a breast pump When choosing a pump, keep the following things in mind:  Manual breast pumps do not need electricity. They cost less. They can be hard to use.  Electric breast pumps use electricity. They are more expensive. They are easier to use. They collect more milk.  The suction cup (flange) should be the right size.  Before you buy the pump, check if your insurance will pay for it. Tips for caring for a breast pump  Check the manual that came with your pump for cleaning tips.  Clean the pump after you use it. To do this: 1. Wipe down the electrical part. Use a dry cloth or paper towel. Do not put this part in water or in cleaning products. 2. Wash the plastic parts with soap and warm water. Or use the dishwasher if the manual says it is safe. You do not need to clean the tubing unless it touched breast milk. 3. Let all the parts air dry. Avoid drying them with a cloth or towel. 4. When the parts are clean and dry, put the pump back together. Then store the pump.  If there is water in the tubing when you want to pump: 1. Attach the tubing to the pump. 2. Turn on the pump. 3. Turn off the pump when the tube is dry.  Try not to touch the inside of pump parts. Summary  Pumping can help you start making milk after your baby is born. It lets you  keep making milk when you are away from your baby.  When you are away from your baby, pump for about 15 minutes every 2-3 hours. Pump both breasts at the same time, if you can. This information is not intended to replace advice given to you by your health care provider. Make sure you discuss any questions you have with your health care provider. Document Released: 08/15/2007 Document Revised: 06/18/2018 Document Reviewed: 04/02/2016 Elsevier Patient Education  2020 [employer]

## 2018-10-15 NOTE — Progress Notes (Signed)
DC to home dc to home via wc

## 2019-05-29 ENCOUNTER — Ambulatory Visit: Payer: 59 | Attending: Internal Medicine

## 2019-05-29 DIAGNOSIS — Z23 Encounter for immunization: Secondary | ICD-10-CM

## 2019-05-29 NOTE — Progress Notes (Signed)
   Covid-19 Vaccination Clinic  Name:  Rebekah Cook    MRN: [id number] DOB: 1980-12-01  05/29/2019  [name] was observed post Covid-19 immunization for 15 minutes without incident. She was provided with Vaccine Information Sheet and instruction to access the V-Safe system.   [name] was instructed to call 911 with any severe reactions post vaccine: Marland Kitchen Difficulty breathing  . Swelling of face and throat  . A fast heartbeat  . A bad rash all over body  . Dizziness and weakness   Immunizations Administered    Name Date Dose VIS Date Route   Pfizer COVID-19 Vaccine 05/29/2019  3:04 PM 0.3 mL 02/20/2019 Intramuscular   Manufacturer: [employer], [employer]   Lot: [id number]   NDC: [address]-2

## 2019-06-24 ENCOUNTER — Ambulatory Visit: Payer: 59 | Attending: Internal Medicine

## 2019-06-24 DIAGNOSIS — Z23 Encounter for immunization: Secondary | ICD-10-CM

## 2019-06-24 NOTE — Progress Notes (Signed)
   Covid-19 Vaccination Clinic  Name:  Rebekah Cook    MRN: [id number] DOB: 12-06-1980  06/24/2019  [name] was observed post Covid-19 immunization for 15 minutes without incident. She was provided with Vaccine Information Sheet and instruction to access the V-Safe system.   [name] was instructed to call 911 with any severe reactions post vaccine: Marland Kitchen Difficulty breathing  . Swelling of face and throat  . A fast heartbeat  . A bad rash all over body  . Dizziness and weakness   Immunizations Administered    Name Date Dose VIS Date Route   Pfizer COVID-19 Vaccine 06/24/2019 10:56 AM 0.3 mL 02/20/2019 Intramuscular   Manufacturer: [employer], [employer]   Lot: [id number]   NDC: [address]-4

## 2020-07-14 ENCOUNTER — Other Ambulatory Visit: Payer: Self-pay

## 2020-07-14 ENCOUNTER — Encounter: Payer: Self-pay | Admitting: Nurse Practitioner

## 2020-07-14 ENCOUNTER — Ambulatory Visit: Payer: 59 | Admitting: Nurse Practitioner

## 2020-07-14 VITALS — BP 123/81 | HR 71 | Temp 98.1°F | Ht 63.3 in | Wt 168.6 lb

## 2020-07-14 DIAGNOSIS — M779 Enthesopathy, unspecified: Secondary | ICD-10-CM | POA: Diagnosis not present

## 2020-07-14 DIAGNOSIS — Z7689 Persons encountering health services in other specified circumstances: Secondary | ICD-10-CM

## 2020-07-14 MED ORDER — METHYLPREDNISOLONE 4 MG PO TBPK: ORAL_TABLET | ORAL | 0 refills | Status: DC

## 2020-07-14 NOTE — Progress Notes (Signed)
BP 123/81   Pulse 71   Temp 98.1 F (36.7 C) (Oral)   Ht 5' 3.3" (1.608 m)   Wt 168 lb 9.6 oz (76.5 kg)   LMP 07/08/2020 (Exact Date)   SpO2 100%   BMI 29.58 kg/m    Subjective:    Patient ID: Rebekah Cook, female    DOB: 01-Apr-1980, 40 y.o.   MRN: [id number]  HPI: Rebekah Cook is a 40 y.o. female  Chief Complaint  Patient presents with  . Establish Care    Pt states she would like to talk about some L knee pain she has been having. States she does not remember any injuries. States it not a sharp or terrible pain, states it is more annoying    Patient presents to clinic to establish care with new PCP.  Patient reports a history of gestational diabetes and patient hasn't had any problems since then.  Patient did blood work in her late 10s and had slightly elevated cholesterol at that time.    Patient denies a history of: Hypertension, Diabetes, Thyroid problems, Depression, Anxiety, Neurological problems, and Abdominal problems.   Patient had two c sections, hysteroscopy, wisdom teeth removal, pilonidal cyst removal.    KNEE PAIN Duration: Since March Involved knee: left Mechanism of injury: unknown Location:lateral Onset: sudden Severity: mild  Quality:  aching Frequency: intermittent Radiation: no Aggravating factors: weight bearing and movement  Alleviating factors: NSAIDs  Status: stable Treatments attempted: none  Relief with NSAIDs?:  moderate Weakness with weight bearing or walking: no Sensation of giving way: no Locking: no Popping: no Bruising: no Swelling: no Redness: no Paresthesias/decreased sensation: no Fevers: no  Denies HA, CP, SOB, dizziness, palpitations, visual changes, and lower extremity swelling.  Relevant past medical, surgical, family and social history reviewed and updated as indicated. Interim medical history since our last visit reviewed. Allergies and medications reviewed and updated.  Review of Systems  Eyes:  Negative for visual disturbance.  Respiratory: Negative for cough, chest tightness and shortness of breath.   Cardiovascular: Negative for chest pain, palpitations and leg swelling.  Musculoskeletal:       Left knee pain  Neurological: Negative for dizziness and headaches.    Per HPI unless specifically indicated above     Objective:    BP 123/81   Pulse 71   Temp 98.1 F (36.7 C) (Oral)   Ht 5' 3.3" (1.608 m)   Wt 168 lb 9.6 oz (76.5 kg)   LMP 07/08/2020 (Exact Date)   SpO2 100%   BMI 29.58 kg/m   Wt Readings from Last 3 Encounters:  07/14/20 168 lb 9.6 oz (76.5 kg)  10/13/18 195 lb (88.5 kg)  09/01/18 195 lb (88.5 kg)    Physical Exam Vitals and nursing note reviewed.  Constitutional:      General: She is not in acute distress.    Appearance: Normal appearance. She is normal weight. She is not ill-appearing, toxic-appearing or diaphoretic.  HENT:     Head: Normocephalic.     Right Ear: External ear normal.     Left Ear: External ear normal.     Nose: Nose normal.     Mouth/Throat:     Mouth: Mucous membranes are moist.     Pharynx: Oropharynx is clear.  Eyes:     General:        Right eye: No discharge.        Left eye: No discharge.     Extraocular Movements: Extraocular  movements intact.     Conjunctiva/sclera: Conjunctivae normal.     Pupils: Pupils are equal, round, and reactive to light.  Cardiovascular:     Rate and Rhythm: Normal rate and regular rhythm.     Heart sounds: No murmur heard.   Pulmonary:     Effort: Pulmonary effort is normal. No respiratory distress.     Breath sounds: Normal breath sounds. No wheezing or rales.  Musculoskeletal:        General: No swelling, tenderness, deformity or signs of injury. Normal range of motion.     Cervical back: Normal range of motion and neck supple.     Right lower leg: No edema.     Left lower leg: No edema.       Legs:  Skin:    General: Skin is warm and dry.     Capillary Refill: Capillary  refill takes less than 2 seconds.  Neurological:     General: No focal deficit present.     Mental Status: She is alert and oriented to person, place, and time. Mental status is at baseline.  Psychiatric:        Mood and Affect: Mood normal.        Behavior: Behavior normal.        Thought Content: Thought content normal.        Judgment: Judgment normal.     Results for orders placed or performed during the hospital encounter of 10/13/18  OB RESULT CONSOLE Group B Strep  Result Value Ref Range   GBS Negative   CBC  Result Value Ref Range   WBC 11.0 (H) 4.0 - 10.5 K/uL   RBC 4.41 3.87 - 5.11 MIL/uL   Hemoglobin 11.5 (L) 12.0 - 15.0 g/dL   HCT 09.3 23.5 - 57.3 %   MCV 82.1 80.0 - 100.0 fL   MCH 26.1 26.0 - 34.0 pg   MCHC 31.8 30.0 - 36.0 g/dL   RDW 22.0 25.4 - 27.0 %   Platelets 190 150 - 400 K/uL   nRBC 0.0 0.0 - 0.2 %  RPR  Result Value Ref Range   RPR Ser Ql Non Reactive Non Reactive  OB RESULTS CONSOLE GC/Chlamydia  Result Value Ref Range   Gonorrhea Negative    Chlamydia Negative   OB RESULTS CONSOLE RPR  Result Value Ref Range   RPR Nonreactive   OB RESULTS CONSOLE HIV antibody  Result Value Ref Range   HIV Non-reactive   OB RESULTS CONSOLE Rubella Antibody  Result Value Ref Range   Rubella Nonimmune   OB RESULTS CONSOLE Varicella zoster antibody, IgG  Result Value Ref Range   Varicella Immune   OB RESULTS CONSOLE Hepatitis B surface antigen  Result Value Ref Range   Hepatitis B Surface Ag Negative   CBC  Result Value Ref Range   WBC 10.7 (H) 4.0 - 10.5 K/uL   RBC 3.63 (L) 3.87 - 5.11 MIL/uL   Hemoglobin 9.6 (L) 12.0 - 15.0 g/dL   HCT 62.3 (L) 76.2 - 83.1 %   MCV 83.7 80.0 - 100.0 fL   MCH 26.4 26.0 - 34.0 pg   MCHC 31.6 30.0 - 36.0 g/dL   RDW 51.7 61.6 - 07.3 %   Platelets 157 150 - 400 K/uL   nRBC 0.0 0.0 - 0.2 %  ABO/Rh  Result Value Ref Range   ABO/RH(D)      O POS Performed at [facility], [address] [place], [place]  [address]   Type and screen [facility]  Result Value Ref Range   ABO/RH(D) O POS    Antibody Screen NEG    Sample Expiration      10/16/2018,2359 Performed at [facility], [address] [place], [place] [address]       Assessment & Plan:   Problem List Items Addressed This Visit   None   Visit Diagnoses    Tendonitis    -  Primary   Complete steroid dose pack. Can use Ibuprofen PRN for pain. Can send patient to PT if symptoms do not resolve.    Encounter to establish care           Follow up plan: Return in about 6 months (around 01/14/2021) for Physical and Fasting labs (PAP).    A total of 30 minutes were spent on this encounter today.  When total time is documented, this includes both the face-to-face and non-face-to-face time personally spent before, during and after the visit on the date of the encounter.

## 2020-08-15 ENCOUNTER — Telehealth: Payer: 59 | Admitting: Family

## 2020-08-15 DIAGNOSIS — J029 Acute pharyngitis, unspecified: Secondary | ICD-10-CM | POA: Diagnosis not present

## 2020-08-15 MED ORDER — AZITHROMYCIN 250 MG PO TABS: ORAL_TABLET | ORAL | 0 refills | Status: DC

## 2020-08-15 NOTE — Progress Notes (Signed)

## 2021-01-17 ENCOUNTER — Other Ambulatory Visit: Payer: Self-pay

## 2021-01-17 ENCOUNTER — Ambulatory Visit (INDEPENDENT_AMBULATORY_CARE_PROVIDER_SITE_OTHER): Payer: 59 | Admitting: Nurse Practitioner

## 2021-01-17 ENCOUNTER — Encounter: Payer: Self-pay | Admitting: Nurse Practitioner

## 2021-01-17 VITALS — BP 125/78 | HR 82 | Temp 98.0°F | Ht 65.0 in | Wt 164.4 lb

## 2021-01-17 DIAGNOSIS — Z0000 Encounter for general adult medical examination without abnormal findings: Secondary | ICD-10-CM | POA: Diagnosis not present

## 2021-01-17 DIAGNOSIS — Z124 Encounter for screening for malignant neoplasm of cervix: Secondary | ICD-10-CM | POA: Diagnosis not present

## 2021-01-17 DIAGNOSIS — F439 Reaction to severe stress, unspecified: Secondary | ICD-10-CM | POA: Diagnosis not present

## 2021-01-17 DIAGNOSIS — Z1159 Encounter for screening for other viral diseases: Secondary | ICD-10-CM

## 2021-01-17 LAB — URINALYSIS, ROUTINE W REFLEX MICROSCOPIC
Bilirubin, UA: NEGATIVE
Glucose, UA: NEGATIVE
Ketones, UA: NEGATIVE
Leukocytes,UA: NEGATIVE
Nitrite, UA: NEGATIVE
Protein,UA: NEGATIVE
RBC, UA: NEGATIVE
Specific Gravity, UA: 1.015 (ref 1.005–1.030)
Urobilinogen, Ur: 0.2 mg/dL (ref 0.2–1.0)
pH, UA: 7 (ref 5.0–7.5)

## 2021-01-17 MED ORDER — ESCITALOPRAM OXALATE 5 MG PO TABS: 5.0000 mg | ORAL_TABLET | Freq: Every day | ORAL | 0 refills | Status: DC

## 2021-01-17 NOTE — Progress Notes (Signed)
BP 125/78   Pulse 82   Temp 98 F (36.7 C) (Oral)   Ht _0  (1.651 m)   Wt 164 lb 6.4 oz (74.6 kg)   LMP 12/19/2020 (Approximate)   SpO2 98%   BMI 27.36 kg/m    Subjective:    Patient ID: Rebekah Cook, female    DOB: May 14, 1980, 40 y.o.   MRN: [id number]  HPI: Rebekah Cook is a 40 y.o. female presenting on 01/17/2021 for comprehensive medical examination. Current medical complaints include: stress  Patient states she has started walking but that hasn't helped her from going "0-60" with her kids.  She is ready to try medication to help with this.    Denies HA, CP, SOB, dizziness, palpitations, visual changes, and lower extremity swelling.  She currently lives with: Menopausal Symptoms: no  Depression Screen done today and results listed below:  Depression screen [facility] 2/9 01/17/2021 07/14/2020 08/27/2018 07/28/2018  Decreased Interest 0 0 0 0  Down, Depressed, Hopeless 0 0 0 0  PHQ - 2 Score 0 0 0 0  Altered sleeping 0 - - -  Tired, decreased energy 0 - - -  Change in appetite 0 - - -  Feeling bad or failure about yourself  0 - - -  Trouble concentrating 0 - - -  Moving slowly or fidgety/restless 0 - - -  Suicidal thoughts 0 - - -  PHQ-9 Score 0 - - -  Difficult doing work/chores Not difficult at all - - -    The patient does not have a history of falls. I did complete a risk assessment for falls. A plan of care for falls was documented.   Past Medical History:  Past Medical History:  Diagnosis Date   Complication of anesthesia    HARD TO WAKE UP FROM WISDOM TEETH   Family history of adverse reaction to anesthesia    MOM-   GERD (gastroesophageal reflux disease)    Gestational diabetes    PCOS (polycystic ovarian syndrome)    PONV (postoperative nausea and vomiting)    WITH PREVIOUS C-SECTION   Vaginal Pap smear, abnormal    2009    Surgical History:  Past Surgical History:  Procedure Laterality Date   CESAREAN SECTION     CESAREAN SECTION N/A  10/13/2018   Procedure: CESAREAN SECTION, REPEAT;  Surgeon: Ward, Honor Loh, MD;  Location: [facility] ORS;  Service: Obstetrics;  Laterality: N/A;   CYST EXCISION     HYSTEROSCOPY Bilateral 2014   MOUTH SURGERY     TEENAGER   WISDOM TOOTH EXTRACTION Bilateral     Medications:  Current Outpatient Medications on File Prior to Visit  Medication Sig   JUNEL FE 1.5/30 1.5-30 MG-MCG tablet Take 1 tablet by mouth daily.   Multiple Vitamin (MULTIVITAMIN) tablet Take 1 tablet by mouth daily.   No current facility-administered medications on file prior to visit.    Allergies:  Allergies  Allergen Reactions   Penicillins Swelling    Swelling of gums Did it involve swelling of the face/tongue/throat, SOB, or low BP? No Did it involve sudden or severe rash/hives, skin peeling, or any reaction on the inside of your mouth or nose? No Did you need to seek medical attention at a hospital or doctor's office? No When did it last happen?      2009 If all above answers are "NO", may proceed with cephalosporin use.    Levofloxacin Nausea And Vomiting    Social History:  Social History   Socioeconomic History   Marital status: Married    Spouse name: Corene Cornea   Number of children: Not on file   Years of education: Not on file   Highest education level: Not on file  Occupational History   Occupation: [employer]  Tobacco Use   Smoking status: Never   Smokeless tobacco: Never  Vaping Use   Vaping Use: Never used  Substance and Sexual Activity   Alcohol use: Not Currently   Drug use: Never   Sexual activity: Not Currently    Birth control/protection: Pill  Other Topics Concern   Not on file  Social History Narrative   Not on file   Social Determinants of Health   Financial Resource Strain: Not on file  Food Insecurity: Not on file  Transportation Needs: Not on file  Physical Activity: Not on file  Stress: Not on file  Social Connections: Not on file  Intimate Partner Violence: Not on file    Social History   Tobacco Use  Smoking Status Never  Smokeless Tobacco Never   Social History   Substance and Sexual Activity  Alcohol Use Not Currently    Family History:  Family History  Problem Relation Age of Onset   Kidney disease Mother    Cancer Mother        kidney   Miscarriages / [place] Mother    Cancer Father        prostate   Heart disease Father    Parkinson's disease Maternal Aunt    Cancer Paternal Uncle    Cancer Maternal Grandmother    Diabetes Maternal Grandmother    Arthritis Maternal Grandmother    Cancer Maternal Grandfather    Arthritis Maternal Grandfather    Stroke Maternal Grandfather    Diabetes Paternal Grandmother    Stroke Paternal Grandmother    Parkinson's disease Paternal Grandfather    Alzheimer's disease Paternal Grandfather     Past medical history, surgical history, medications, allergies, family history and social history reviewed with patient today and changes made to appropriate areas of the chart.   Review of Systems  Eyes:  Negative for blurred vision and double vision.  Respiratory:  Negative for shortness of breath.   Cardiovascular:  Negative for chest pain, palpitations and leg swelling.  Neurological:  Negative for dizziness and headaches.  Psychiatric/Behavioral:         Stress  All other ROS negative except what is listed above and in the HPI.      Objective:    BP 125/78   Pulse 82   Temp 98 F (36.7 C) (Oral)   Ht _0  (1.651 m)   Wt 164 lb 6.4 oz (74.6 kg)   LMP 12/19/2020 (Approximate)   SpO2 98%   BMI 27.36 kg/m   Wt Readings from Last 3 Encounters:  01/17/21 164 lb 6.4 oz (74.6 kg)  07/14/20 168 lb 9.6 oz (76.5 kg)  10/13/18 195 lb (88.5 kg)    Physical Exam Vitals and nursing note reviewed.  Constitutional:      General: She is awake. She is not in acute distress.    Appearance: She is well-developed. She is not ill-appearing.  HENT:     Head: Normocephalic and atraumatic.     Right  Ear: Hearing, tympanic membrane, ear canal and external ear normal. No drainage.     Left Ear: Hearing, tympanic membrane, ear canal and external ear normal. No drainage.     Nose: Nose normal.  Right Sinus: No maxillary sinus tenderness or frontal sinus tenderness.     Left Sinus: No maxillary sinus tenderness or frontal sinus tenderness.     Mouth/Throat:     Mouth: Mucous membranes are moist.     Pharynx: Oropharynx is clear. Uvula midline. No pharyngeal swelling, oropharyngeal exudate or posterior oropharyngeal erythema.  Eyes:     General: Lids are normal.        Right eye: No discharge.        Left eye: No discharge.     Extraocular Movements: Extraocular movements intact.     Conjunctiva/sclera: Conjunctivae normal.     Pupils: Pupils are equal, round, and reactive to light.     Visual Fields: Right eye visual fields normal and left eye visual fields normal.  Neck:     Thyroid: No thyromegaly.     Vascular: No carotid bruit.     Trachea: Trachea normal.  Cardiovascular:     Rate and Rhythm: Normal rate and regular rhythm.     Heart sounds: Normal heart sounds. No murmur heard.   No gallop.  Pulmonary:     Effort: Pulmonary effort is normal. No accessory muscle usage or respiratory distress.     Breath sounds: Normal breath sounds.  Chest:  Breasts:    Right: Normal.     Left: Normal.  Abdominal:     General: Bowel sounds are normal.     Palpations: Abdomen is soft. There is no hepatomegaly or splenomegaly.     Tenderness: There is no abdominal tenderness.  Musculoskeletal:        General: Normal range of motion.     Cervical back: Normal range of motion and neck supple.     Right lower leg: No edema.     Left lower leg: No edema.  Lymphadenopathy:     Head:     Right side of head: No submental, submandibular, tonsillar, preauricular or posterior auricular adenopathy.     Left side of head: No submental, submandibular, tonsillar, preauricular or posterior auricular  adenopathy.     Cervical: No cervical adenopathy.     Upper Body:     Right upper body: No supraclavicular, axillary or pectoral adenopathy.     Left upper body: No supraclavicular, axillary or pectoral adenopathy.  Skin:    General: Skin is warm and dry.     Capillary Refill: Capillary refill takes less than 2 seconds.     Findings: No rash.  Neurological:     Mental Status: She is alert and oriented to person, place, and time.     Gait: Gait is intact.     Deep Tendon Reflexes: Reflexes are normal and symmetric.     Reflex Scores:      Brachioradialis reflexes are 2+ on the right side and 2+ on the left side.      Patellar reflexes are 2+ on the right side and 2+ on the left side. Psychiatric:        Attention and Perception: Attention normal.        Mood and Affect: Mood normal.        Speech: Speech normal.        Behavior: Behavior normal. Behavior is cooperative.        Thought Content: Thought content normal.        Judgment: Judgment normal.    Results for orders placed or performed during the hospital encounter of 10/13/18  OB RESULT CONSOLE Group B Strep  Result  Value Ref Range   GBS Negative   CBC  Result Value Ref Range   WBC 11.0 (H) 4.0 - 10.5 K/uL   RBC 4.41 3.87 - 5.11 MIL/uL   Hemoglobin 11.5 (L) 12.0 - 15.0 g/dL   HCT 36.2 36.0 - 46.0 %   MCV 82.1 80.0 - 100.0 fL   MCH 26.1 26.0 - 34.0 pg   MCHC 31.8 30.0 - 36.0 g/dL   RDW 14.6 11.5 - 15.5 %   Platelets 190 150 - 400 K/uL   nRBC 0.0 0.0 - 0.2 %  RPR  Result Value Ref Range   RPR Ser Ql Non Reactive Non Reactive  OB RESULTS CONSOLE GC/Chlamydia  Result Value Ref Range   Gonorrhea Negative    Chlamydia Negative   OB RESULTS CONSOLE RPR  Result Value Ref Range   RPR Nonreactive   OB RESULTS CONSOLE HIV antibody  Result Value Ref Range   HIV Non-reactive   OB RESULTS CONSOLE Rubella Antibody  Result Value Ref Range   Rubella Nonimmune   OB RESULTS CONSOLE Varicella zoster antibody, IgG  Result  Value Ref Range   Varicella Immune   OB RESULTS CONSOLE Hepatitis B surface antigen  Result Value Ref Range   Hepatitis B Surface Ag Negative   CBC  Result Value Ref Range   WBC 10.7 (H) 4.0 - 10.5 K/uL   RBC 3.63 (L) 3.87 - 5.11 MIL/uL   Hemoglobin 9.6 (L) 12.0 - 15.0 g/dL   HCT 30.4 (L) 36.0 - 46.0 %   MCV 83.7 80.0 - 100.0 fL   MCH 26.4 26.0 - 34.0 pg   MCHC 31.6 30.0 - 36.0 g/dL   RDW 15.0 11.5 - 15.5 %   Platelets 157 150 - 400 K/uL   nRBC 0.0 0.0 - 0.2 %  ABO/Rh  Result Value Ref Range   ABO/RH(D)      O POS Performed at [facility], [address] [place], [place] [address]   Type and screen [place]  Result Value Ref Range   ABO/RH(D) O POS    Antibody Screen NEG    Sample Expiration      10/16/2018,2359 Performed at [facility], [address] [place], [place] [address]       Assessment & Plan:   Problem List Items Addressed This Visit       Other   Stress    Ongoing. Will start Lexapro 23m to help level out mood.  Side effects and benefits of medication discussed with patient during visit.  Follow up in 1 month for reevaluation.       Other Visit Diagnoses     Annual physical exam    -  Primary   Health maintenance reviewed during visit today. Labs ordered. Will get PAP at GYN.   Relevant Orders   CBC with Differential/Platelet   Comprehensive metabolic panel   Lipid panel   TSH   Urinalysis, Routine w reflex microscopic   Screening for cervical cancer       Encounter for hepatitis C screening test for low risk patient       Relevant Orders   Hepatitis C Antibody        Follow up plan: Return in about 1 year (around 01/17/2022) for Physical and Fasting labs.   LABORATORY TESTING:  - Pap smear: done elsewhere  IMMUNIZATIONS:   - Tdap: Tetanus vaccination status reviewed: last tetanus booster within 10 years. - Influenza: Up to  date - Pneumovax: Not applicable - Prevnar: Not applicable -  HPV: Up to date - Zostavax vaccine: Not applicable  SCREENING: -Mammogram: Not applicable  - Colonoscopy: Not applicable  - Bone Density: Not applicable  -Hearing Test: Not applicable  -Spirometry: Not applicable   PATIENT COUNSELING:   Advised to take 1 mg of folate supplement per day if capable of pregnancy.   Sexuality: Discussed sexually transmitted diseases, partner selection, use of condoms, avoidance of unintended pregnancy  and contraceptive alternatives.   Advised to avoid cigarette smoking.  I discussed with the patient that most people either abstain from alcohol or drink within safe limits (<=14/week and <=4 drinks/occasion for males, <=7/weeks and <= 3 drinks/occasion for females) and that the risk for alcohol disorders and other health effects rises proportionally with the number of drinks per week and how often a drinker exceeds daily limits.  Discussed cessation/primary prevention of drug use and availability of treatment for abuse.   Diet: Encouraged to adjust caloric intake to maintain  or achieve ideal body weight, to reduce intake of dietary saturated fat and total fat, to limit sodium intake by avoiding high sodium foods and not adding table salt, and to maintain adequate dietary potassium and calcium preferably from fresh fruits, vegetables, and low-fat dairy products.    stressed the importance of regular exercise  Injury prevention: Discussed safety belts, safety helmets, smoke detector, smoking near bedding or upholstery.   Dental health: Discussed importance of regular tooth brushing, flossing, and dental visits.    NEXT PREVENTATIVE PHYSICAL DUE IN 1 YEAR. Return in about 1 year (around 01/17/2022) for Physical and Fasting labs.

## 2021-01-17 NOTE — Assessment & Plan Note (Signed)
Ongoing. Will start Lexapro 5mg  to help level out mood.  Side effects and benefits of medication discussed with patient during visit.  Follow up in 1 month for reevaluation.

## 2021-01-17 NOTE — Progress Notes (Signed)
Hi Rebekah Cook. Your urine from today looks good.  I will send you another message once the rest of your lab work comes back.

## 2021-01-18 LAB — COMPREHENSIVE METABOLIC PANEL
ALT: 11 IU/L (ref 0–32)
AST: 10 IU/L (ref 0–40)
Albumin/Globulin Ratio: 1.7 (ref 1.2–2.2)
Albumin: 4.3 g/dL (ref 3.8–4.8)
Alkaline Phosphatase: 49 IU/L (ref 44–121)
BUN/Creatinine Ratio: 11 (ref 9–23)
BUN: 9 mg/dL (ref 6–20)
Bilirubin Total: 0.4 mg/dL (ref 0.0–1.2)
CO2: 20 mmol/L (ref 20–29)
Calcium: 9.1 mg/dL (ref 8.7–10.2)
Chloride: 104 mmol/L (ref 96–106)
Creatinine, Ser: 0.82 mg/dL (ref 0.57–1.00)
Globulin, Total: 2.6 g/dL (ref 1.5–4.5)
Glucose: 84 mg/dL (ref 70–99)
Potassium: 4.1 mmol/L (ref 3.5–5.2)
Sodium: 139 mmol/L (ref 134–144)
Total Protein: 6.9 g/dL (ref 6.0–8.5)
eGFR: 93 mL/min/{1.73_m2} (ref >59)

## 2021-01-18 LAB — CBC WITH DIFFERENTIAL/PLATELET
Basophils Absolute: 0 10*3/uL (ref 0.0–0.2)
Basos: 0 %
EOS (ABSOLUTE): 0.1 10*3/uL (ref 0.0–0.4)
Eos: 1 %
Hematocrit: 40.7 % (ref 34.0–46.6)
Hemoglobin: 13.4 g/dL (ref 11.1–15.9)
Immature Grans (Abs): 0 10*3/uL (ref 0.0–0.1)
Immature Granulocytes: 0 %
Lymphocytes Absolute: 2.3 10*3/uL (ref 0.7–3.1)
Lymphs: 32 %
MCH: 28.6 pg (ref 26.6–33.0)
MCHC: 32.9 g/dL (ref 31.5–35.7)
MCV: 87 fL (ref 79–97)
Monocytes Absolute: 0.3 10*3/uL (ref 0.1–0.9)
Monocytes: 4 %
Neutrophils Absolute: 4.6 10*3/uL (ref 1.4–7.0)
Neutrophils: 63 %
Platelets: 306 10*3/uL (ref 150–450)
RBC: 4.69 x10E6/uL (ref 3.77–5.28)
RDW: 12.2 % (ref 11.7–15.4)
WBC: 7.3 10*3/uL (ref 3.4–10.8)

## 2021-01-18 LAB — LIPID PANEL
Chol/HDL Ratio: 4.9 ratio — ABNORMAL HIGH (ref 0.0–4.4)
Cholesterol, Total: 249 mg/dL — ABNORMAL HIGH (ref 100–199)
HDL: 51 mg/dL (ref >39)
LDL Chol Calc (NIH): 185 mg/dL — ABNORMAL HIGH (ref 0–99)
Triglycerides: 78 mg/dL (ref 0–149)
VLDL Cholesterol Cal: 13 mg/dL (ref 5–40)

## 2021-01-18 LAB — TSH: TSH: 1.31 u[IU]/mL (ref 0.450–4.500)

## 2021-01-18 LAB — HEPATITIS C ANTIBODY: Hep C Virus Ab: <0.1 s/co ratio (ref 0.0–0.9)

## 2021-01-18 NOTE — Progress Notes (Signed)
Hi Rebekah Cook. Your lab work looks good.  Your cholesterol is elevated.  I recommend following a low fat diet and exercise. We will continue to monitor this at future visits.  Please let me know if you have any questions.

## 2021-02-17 ENCOUNTER — Telehealth (INDEPENDENT_AMBULATORY_CARE_PROVIDER_SITE_OTHER): Payer: 59 | Admitting: Nurse Practitioner

## 2021-02-17 ENCOUNTER — Encounter: Payer: Self-pay | Admitting: Nurse Practitioner

## 2021-02-17 ENCOUNTER — Ambulatory Visit: Payer: 59 | Admitting: Nurse Practitioner

## 2021-02-17 DIAGNOSIS — F439 Reaction to severe stress, unspecified: Secondary | ICD-10-CM

## 2021-02-17 MED ORDER — ESCITALOPRAM OXALATE 10 MG PO TABS: 10.0000 mg | ORAL_TABLET | Freq: Every day | ORAL | 0 refills | Status: DC

## 2021-02-17 NOTE — Progress Notes (Signed)
There were no vitals taken for this visit.   Subjective:    Patient ID: Rebekah Cook, female    DOB: 03-19-1980, 40 y.o.   MRN: [id number]  HPI: Rebekah Cook is a 40 y.o. female  Chief Complaint  Patient presents with   Stress    1 month f/up    STRESS Patient feels like she isn't feeling much than she was before.  She understands that it will take some time to improve. She would like to increase her dose.  Denies SI.  Flowsheet Row Video Visit from 02/17/2021 in [facility]  PHQ-9 Total Score 0      GAD 7 : Generalized Anxiety Score 02/17/2021  Nervous, Anxious, on Edge 0  Control/stop worrying 0  Worry too much - different things 0  Trouble relaxing 0  Restless 0  Easily annoyed or irritable 0  Afraid - awful might happen 0  Total GAD 7 Score 0  Anxiety Difficulty Not difficult at all      Relevant past medical, surgical, family and social history reviewed and updated as indicated. Interim medical history since our last visit reviewed. Allergies and medications reviewed and updated.  Review of Systems  Psychiatric/Behavioral:  Negative for dysphoric mood. The patient is not nervous/anxious.        Stress   Per HPI unless specifically indicated above     Objective:    There were no vitals taken for this visit.  Wt Readings from Last 3 Encounters:  01/17/21 164 lb 6.4 oz (74.6 kg)  07/14/20 168 lb 9.6 oz (76.5 kg)  10/13/18 195 lb (88.5 kg)    Physical Exam Vitals and nursing note reviewed.  Constitutional:      General: She is not in acute distress.    Appearance: Normal appearance. She is normal weight. She is not ill-appearing, toxic-appearing or diaphoretic.  HENT:     Head: Normocephalic.     Right Ear: External ear normal.     Left Ear: External ear normal.     Nose: Nose normal.     Mouth/Throat:     Mouth: Mucous membranes are moist.     Pharynx: Oropharynx is clear.  Eyes:     General:        Right eye: No  discharge.        Left eye: No discharge.     Extraocular Movements: Extraocular movements intact.     Conjunctiva/sclera: Conjunctivae normal.     Pupils: Pupils are equal, round, and reactive to light.  Cardiovascular:     Rate and Rhythm: Normal rate and regular rhythm.     Heart sounds: No murmur heard. Pulmonary:     Effort: Pulmonary effort is normal. No respiratory distress.     Breath sounds: Normal breath sounds. No wheezing or rales.  Musculoskeletal:     Cervical back: Normal range of motion and neck supple.  Skin:    General: Skin is warm and dry.     Capillary Refill: Capillary refill takes less than 2 seconds.  Neurological:     General: No focal deficit present.     Mental Status: She is alert and oriented to person, place, and time. Mental status is at baseline.  Psychiatric:        Mood and Affect: Mood normal.        Behavior: Behavior normal.        Thought Content: Thought content normal.  Judgment: Judgment normal.    Results for orders placed or performed in visit on 01/17/21  CBC with Differential/Platelet  Result Value Ref Range   WBC 7.3 3.4 - 10.8 x10E3/uL   RBC 4.69 3.77 - 5.28 x10E6/uL   Hemoglobin 13.4 11.1 - 15.9 g/dL   Hematocrit 40.7 34.0 - 46.6 %   MCV 87 79 - 97 fL   MCH 28.6 26.6 - 33.0 pg   MCHC 32.9 31.5 - 35.7 g/dL   RDW 12.2 11.7 - 15.4 %   Platelets 306 150 - 450 x10E3/uL   Neutrophils 63 Not Estab. %   Lymphs 32 Not Estab. %   Monocytes 4 Not Estab. %   Eos 1 Not Estab. %   Basos 0 Not Estab. %   Neutrophils Absolute 4.6 1.4 - 7.0 x10E3/uL   Lymphocytes Absolute 2.3 0.7 - 3.1 x10E3/uL   Monocytes Absolute 0.3 0.1 - 0.9 x10E3/uL   EOS (ABSOLUTE) 0.1 0.0 - 0.4 x10E3/uL   Basophils Absolute 0.0 0.0 - 0.2 x10E3/uL   Immature Granulocytes 0 Not Estab. %   Immature Grans (Abs) 0.0 0.0 - 0.1 x10E3/uL  Comprehensive metabolic panel  Result Value Ref Range   Glucose 84 70 - 99 mg/dL   BUN 9 6 - 20 mg/dL   Creatinine, Ser 0.82  0.57 - 1.00 mg/dL   eGFR 93 >59 mL/min/1.73   BUN/Creatinine Ratio 11 9 - 23   Sodium 139 134 - 144 mmol/L   Potassium 4.1 3.5 - 5.2 mmol/L   Chloride 104 96 - 106 mmol/L   CO2 20 20 - 29 mmol/L   Calcium 9.1 8.7 - 10.2 mg/dL   Total Protein 6.9 6.0 - 8.5 g/dL   Albumin 4.3 3.8 - 4.8 g/dL   Globulin, Total 2.6 1.5 - 4.5 g/dL   Albumin/Globulin Ratio 1.7 1.2 - 2.2   Bilirubin Total 0.4 0.0 - 1.2 mg/dL   Alkaline Phosphatase 49 44 - 121 IU/L   AST 10 0 - 40 IU/L   ALT 11 0 - 32 IU/L  Lipid panel  Result Value Ref Range   Cholesterol, Total 249 (H) 100 - 199 mg/dL   Triglycerides 78 0 - 149 mg/dL   HDL 51 >39 mg/dL   VLDL Cholesterol Cal 13 5 - 40 mg/dL   LDL Chol Calc (NIH) 185 (H) 0 - 99 mg/dL   Chol/HDL Ratio 4.9 (H) 0.0 - 4.4 ratio  TSH  Result Value Ref Range   TSH 1.310 0.450 - 4.500 uIU/mL  Urinalysis, Routine w reflex microscopic  Result Value Ref Range   Specific Gravity, UA 1.015 1.005 - 1.030   pH, UA 7.0 5.0 - 7.5   Color, UA Yellow Yellow   Appearance Ur Clear Clear   Leukocytes,UA Negative Negative   Protein,UA Negative Negative/Trace   Glucose, UA Negative Negative   Ketones, UA Negative Negative   RBC, UA Negative Negative   Bilirubin, UA Negative Negative   Urobilinogen, Ur 0.2 0.2 - 1.0 mg/dL   Nitrite, UA Negative Negative  Hepatitis C Antibody  Result Value Ref Range   Hep C Virus Ab <0.1 0.0 - 0.9 s/co ratio      Assessment & Plan:   Problem List Items Addressed This Visit       Other   Stress - Primary    Chronic. Has not seen much improvement with Lexapro $RemoveBefo'5mg'[id number]$ .  Will increase dose to $Remov'10mg'lBSOOI$  daily.  If patient tolerating medication well after 2 weeks can increase to  Lexapro '20mg'$ . Follow up in 6 weeks for reevaluation.         Follow up plan: Return in about 6 weeks (around 03/31/2021) for Depression/Anxiety FU (virtual at 1120 or 420).   This visit was completed via MyChart due to the restrictions of the COVID-19 pandemic. All issues as  above were discussed and addressed. Physical exam was done as above through visual confirmation on MyChart. If it was felt that the patient should be evaluated in the office, they were directed there. The patient verbally consented to this visit. Location of the patient: Home Location of the provider: Office Those involved with this call:  Provider: Jon Billings, NP CMA: Yvonna Alanis, [place] Desk/Registration: Myrlene Broker This encounter was conducted via video.  I spent 20 dedicated to the care of this patient on the date of this encounter to include previsit review of 30, face to face time with the patient, and post visit ordering of testing.

## 2021-02-17 NOTE — Assessment & Plan Note (Signed)
Chronic. Has not seen much improvement with Lexapro 5mg .  Will increase dose to 10mg  daily.  If patient tolerating medication well after 2 weeks can increase to Lexapro 20mg . Follow up in 6 weeks for reevaluation.

## 2021-02-19 IMAGING — US US MFM OB FOLLOW UP
1 series · 13 of 28 positions shown · non-contrast
Comparison: none

PATIENT INFO:

PERFORMED BY:
                   Sonographer                              DREY
SERVICE(S) PROVIDED:
 ----------------------------------------------------------------------
INDICATIONS:
  28 weeks gestation of pregnancy
FETAL EVALUATION:
 Num Of Fetuses:         1
 Preg. Location:         Mid
 Fetal Heart Rate(bpm):  133
 Cardiac Activity:       Present
 Fetal Lie:              Maternal Right
 Presentation:           Vertex
 Placenta:               Posterior Grade 0 No previa
 Amniotic Fluid
 AFI FV:      Within normal limits
 AFI Sum(cm)     %Tile       Largest Pocket(cm)
 16              58
BIOMETRY:
 BPD:        71  mm     G. Age:  28w 4d         45  %    CI:        71.21   %    70 - 86
                                                         FL/HC:      22.1   %    18.8 -
 HC:       268   mm     G. Age:  29w 1d         47  %    HC/AC:      1.00        1.05 -
 AC:      266.7  mm     G. Age:  30w 5d         96  %    FL/BPD:     83.2   %    71 - 87
 FL:       59.1  mm     G. Age:  30w 6d         93  %    FL/AC:      22.2   %    20 - 24
 HUM:      53.3  mm     G. Age:  31w 0d       > 95  %
 Est. FW:    3676  gm      3 lb 8 oz   > 97  %
GESTATIONAL AGE:
 U/S Today:     29w 6d                                        EDD:   10/07/18
 Best:          28w 2d     Det. By:  Embryo Transfer          EDD:   10/18/18
                                     (01/30/18)
ANATOMY:
 Cranium:               Within Normal Limits   LVOT:                   Normal appearance
 Cavum:                 Within Normal Limits   Aortic Arch:            Normal appearance
 Ventricles:            Normal appearance      Ductal Arch:            Normal appearance
 Cerebellum:            Within Normal Limits   Diaphragm:              Within Normal Limits
 Posterior Fossa:       Within Normal Limits   Stomach:                Seen
 Face:                  Within Normal Limits   Abdomen:                Normal appearance
 Lips:                  Normal appearance      Kidneys:                Normal appearance
 Thoracic:              Normal appearance      Bladder:                Seen
 Heart:                 Normal appearance      Spine:                  Normal appearance
 RVOT:                  Normal appearance

[Series 1: us mfm ob follow up · 0.25mm/px · 13 of 56 slices shown]
[im 3/56]
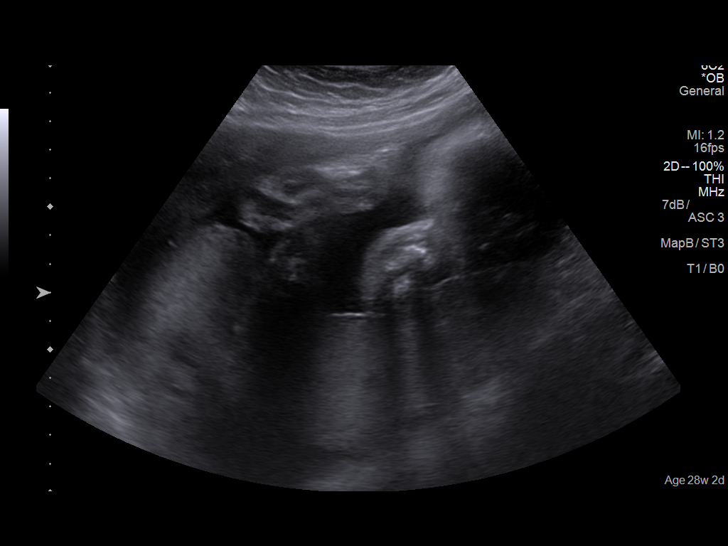
[im 7/56]
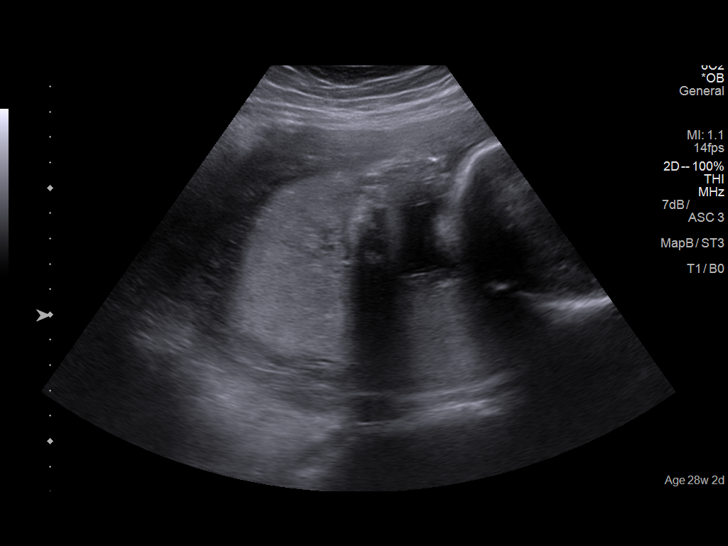
[im 11/56]
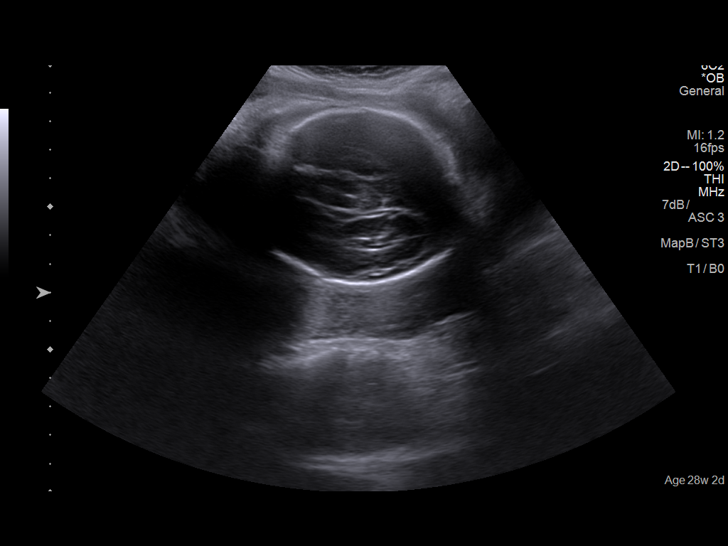
[im 15/56]
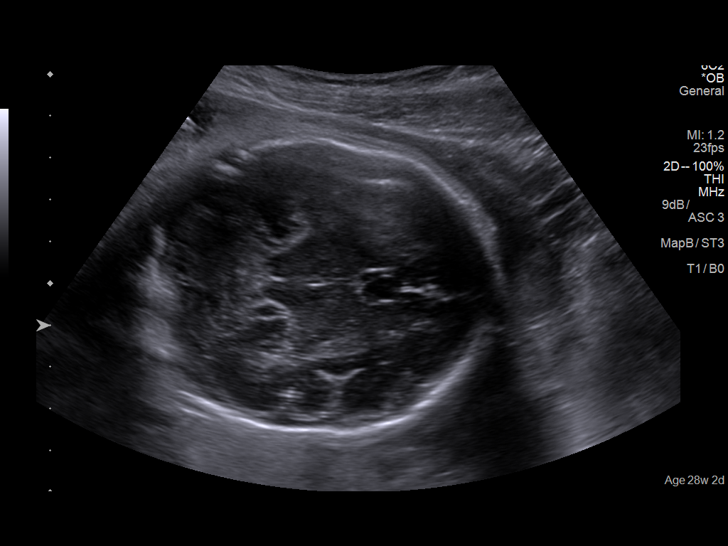
[im 19/56]
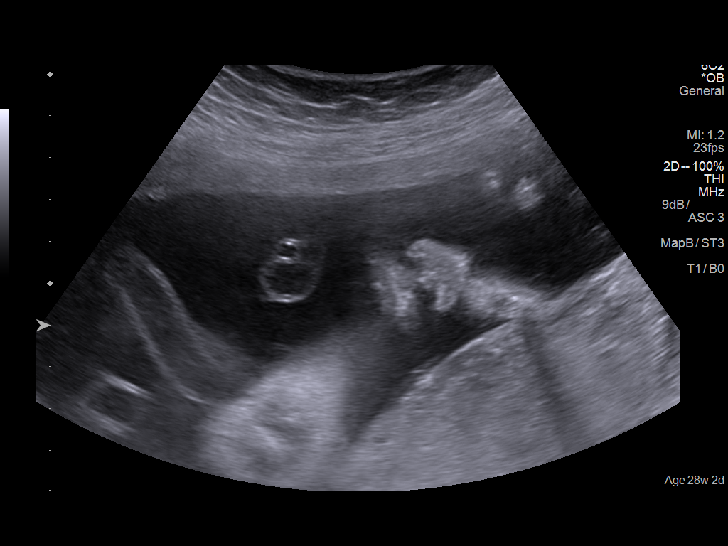
[im 23/56]
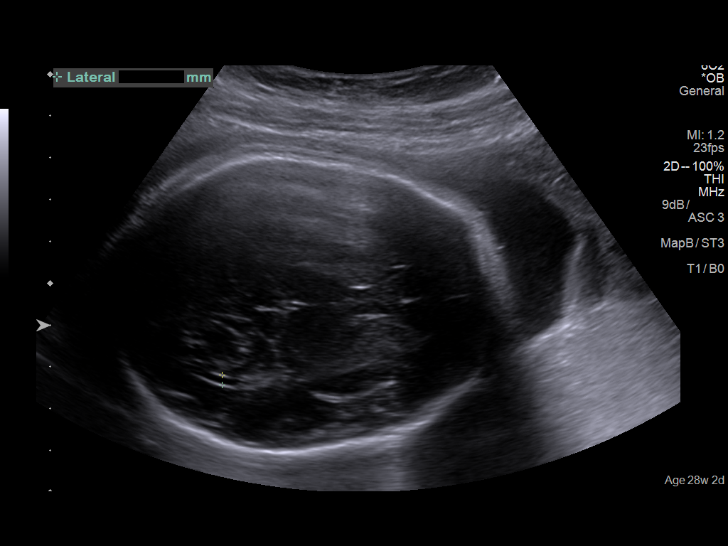
[im 29/56]
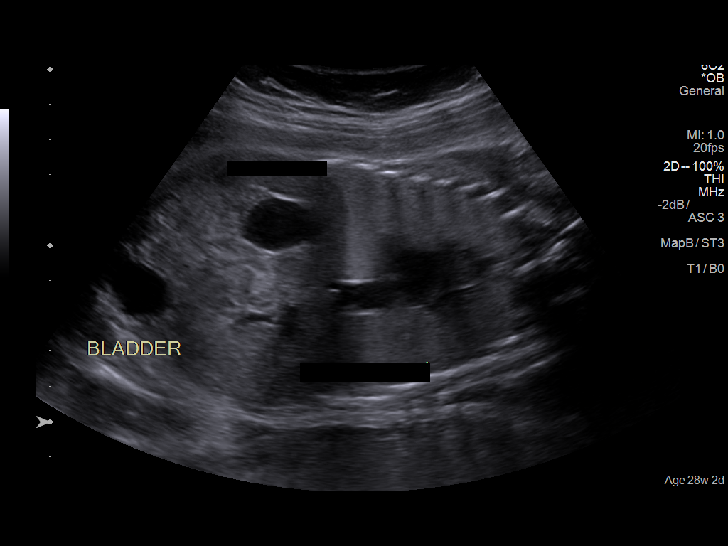
[im 33/56]
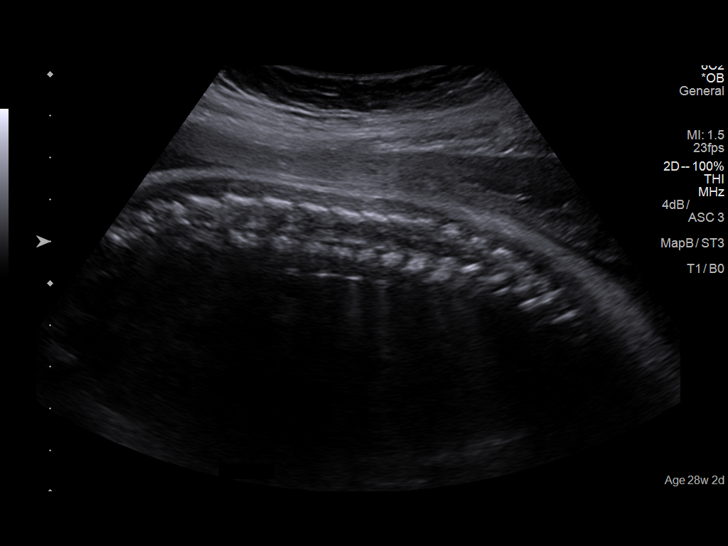
[im 37/56]
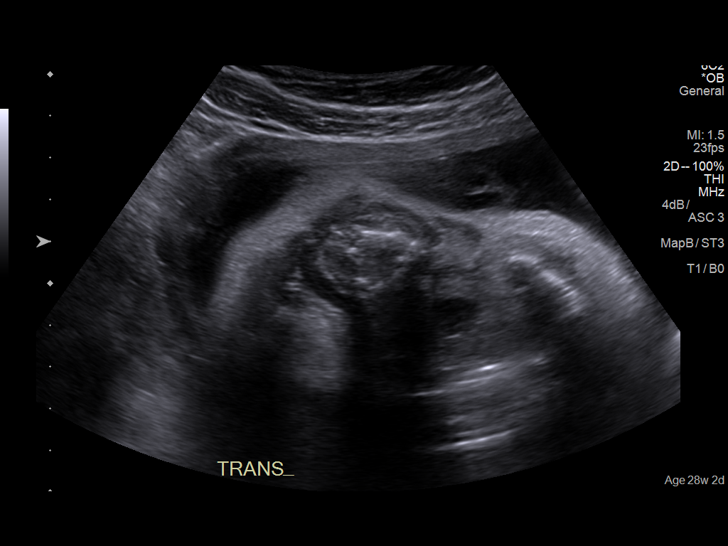
[im 41/56]
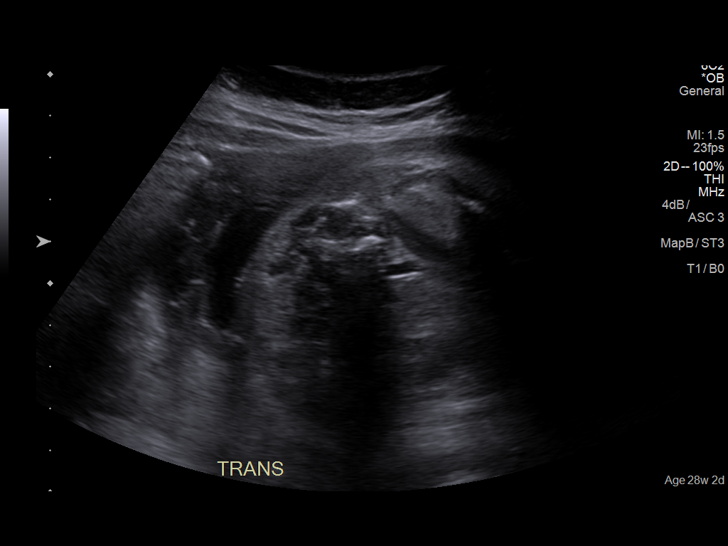
[im 45/56]
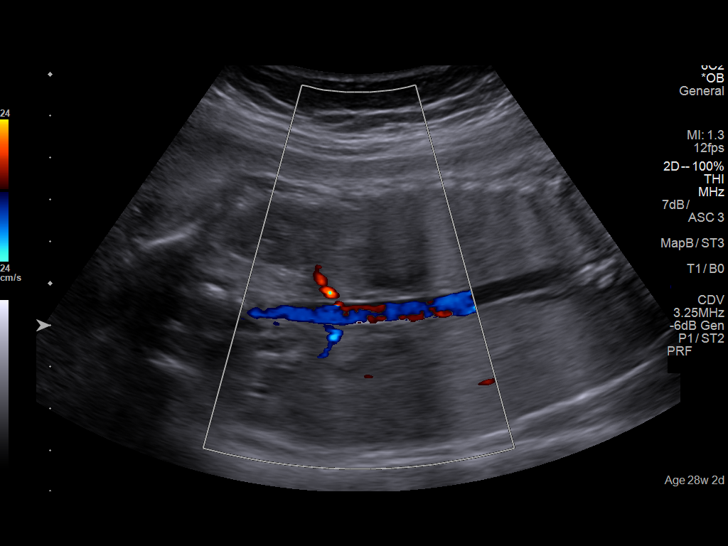
[im 49/56]
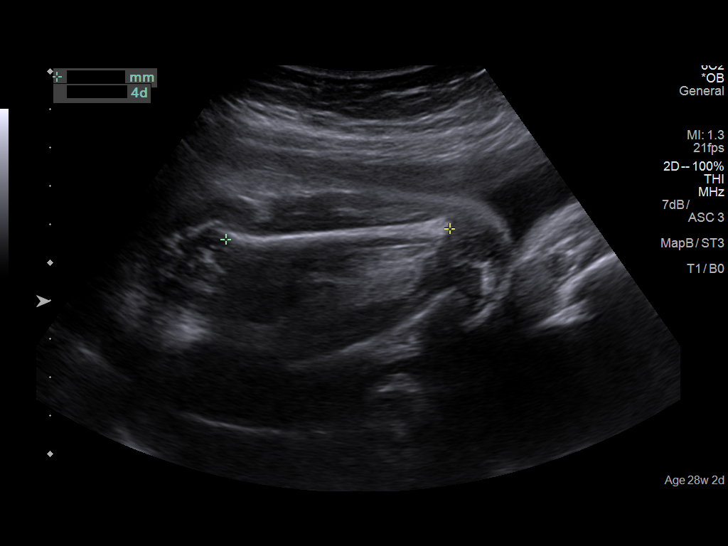
[im 53/56]
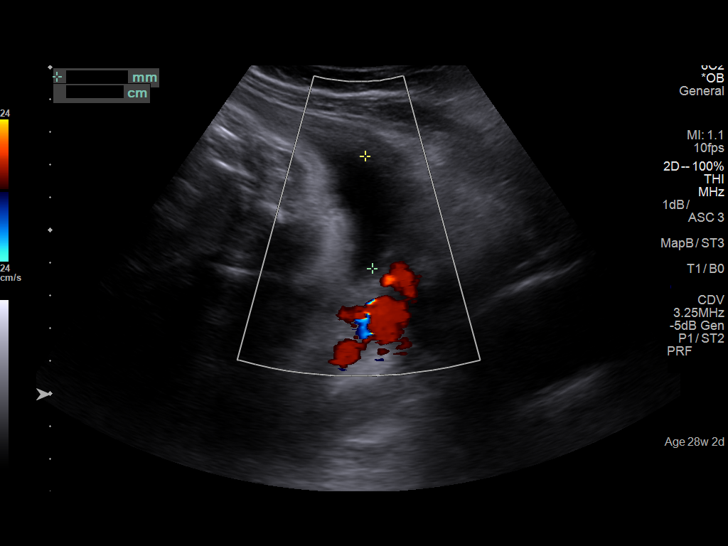

[13 of 28 positions shown; findings below may reference images not displayed]

IMPRESSION: Thank you for referring your patient  for a fetal growth
 evaluation.  Dating is by IVF transfer date.

 There is a singleton gestation at 28 weeks 2 days with normal
 amniotic fluid volume.

 The fetal biometry correlates with established dating. Fetal
 anatomic survey is unremarkable (cardiac views).  The
 placenta is no longer low lying and well away from the
 internal cervical os.

 Recommend follow-up scan for fetal growth as clinically
 indicated.

 Thank you for allowing us to participate in your patient's care.
 assistance.

                  [place], [place] Harpa

## 2021-03-24 ENCOUNTER — Other Ambulatory Visit: Payer: Self-pay | Admitting: Nurse Practitioner

## 2021-03-24 NOTE — Telephone Encounter (Signed)
Requested Prescriptions  Pending Prescriptions Disp Refills   escitalopram (LEXAPRO) 10 MG tablet [Pharmacy Med Name: Escitalopram Oxalate 10 MG Oral Tablet] 60 tablet 0    Sig: Take 1 tablet by mouth once daily     Psychiatry:  Antidepressants - SSRI Passed - 03/24/2021  7:11 PM      Passed - Valid encounter within last 6 months    Recent Outpatient Visits          1 month ago Stress   [facility] Larae Grooms, NP   2 months ago Annual physical exam   [facility] Larae Grooms, NP   8 months ago Tendonitis   [facility] Larae Grooms, NP      Future Appointments            In 1 week Larae Grooms, NP [facility]   In 9 months Larae Grooms, NP [facility]

## 2021-03-30 NOTE — Progress Notes (Signed)
There were no vitals taken for this visit.   Subjective:    Patient ID: Rebekah Cook, female    DOB: [birth year], 41 y.o.   MRN: [id number]  HPI: Rebekah Cook is a 41 y.o. female  Chief Complaint  Patient presents with   Anxiety    STRESS Patient feels like she feels like the edge has been taken off.  She states her husband has noticed a difference also. She is currently taking Lexapro 6m.  She feels like things are less overwhelming.    Flowsheet Row Video Visit from 03/31/2021 in [facility] PHQ-9 Total Score 2      GAD 7 : Generalized Anxiety Score 03/31/2021 02/17/2021  Nervous, Anxious, on Edge 1 0  Control/stop worrying 0 0  Worry too much - different things 1 0  Trouble relaxing 0 0  Restless 0 0  Easily annoyed or irritable 0 0  Afraid - awful might happen 0 0  Total GAD 7 Score 2 0  Anxiety Difficulty Not difficult at all Not difficult at all      Relevant past medical, surgical, family and social history reviewed and updated as indicated. Interim medical history since our last visit reviewed. Allergies and medications reviewed and updated.  Review of Systems  Psychiatric/Behavioral:  Negative for dysphoric mood. The patient is not nervous/anxious.        Stress   Per HPI unless specifically indicated above     Objective:    There were no vitals taken for this visit.  Wt Readings from Last 3 Encounters:  01/17/21 164 lb 6.4 oz (74.6 kg)  07/14/20 168 lb 9.6 oz (76.5 kg)  10/13/18 195 lb (88.5 kg)    Physical Exam Vitals and nursing note reviewed.  Constitutional:      General: She is not in acute distress.    Appearance: She is not ill-appearing.  HENT:     Head: Normocephalic.     Right Ear: Hearing normal.     Left Ear: Hearing normal.     Nose: Nose normal.  Pulmonary:     Effort: Pulmonary effort is normal. No respiratory distress.  Neurological:     Mental Status: She is alert.  Psychiatric:        Mood and  Affect: Mood normal.        Behavior: Behavior normal.        Thought Content: Thought content normal.        Judgment: Judgment normal.    Results for orders placed or performed in visit on 01/17/21  CBC with Differential/Platelet  Result Value Ref Range   WBC 7.3 3.4 - 10.8 x10E3/uL   RBC 4.69 3.77 - 5.28 x10E6/uL   Hemoglobin 13.4 11.1 - 15.9 g/dL   Hematocrit 40.7 34.0 - 46.6 %   MCV 87 79 - 97 fL   MCH 28.6 26.6 - 33.0 pg   MCHC 32.9 31.5 - 35.7 g/dL   RDW 12.2 11.7 - 15.4 %   Platelets 306 150 - 450 x10E3/uL   Neutrophils 63 Not Estab. %   Lymphs 32 Not Estab. %   Monocytes 4 Not Estab. %   Eos 1 Not Estab. %   Basos 0 Not Estab. %   Neutrophils Absolute 4.6 1.4 - 7.0 x10E3/uL   Lymphocytes Absolute 2.3 0.7 - 3.1 x10E3/uL   Monocytes Absolute 0.3 0.1 - 0.9 x10E3/uL   EOS (ABSOLUTE) 0.1 0.0 - 0.4 x10E3/uL   Basophils Absolute 0.0  0.0 - 0.2 x10E3/uL   Immature Granulocytes 0 Not Estab. %   Immature Grans (Abs) 0.0 0.0 - 0.1 x10E3/uL  Comprehensive metabolic panel  Result Value Ref Range   Glucose 84 70 - 99 mg/dL   BUN 9 6 - 20 mg/dL   Creatinine, Ser 0.82 0.57 - 1.00 mg/dL   eGFR 93 >59 mL/min/1.73   BUN/Creatinine Ratio 11 9 - 23   Sodium 139 134 - 144 mmol/L   Potassium 4.1 3.5 - 5.2 mmol/L   Chloride 104 96 - 106 mmol/L   CO2 20 20 - 29 mmol/L   Calcium 9.1 8.7 - 10.2 mg/dL   Total Protein 6.9 6.0 - 8.5 g/dL   Albumin 4.3 3.8 - 4.8 g/dL   Globulin, Total 2.6 1.5 - 4.5 g/dL   Albumin/Globulin Ratio 1.7 1.2 - 2.2   Bilirubin Total 0.4 0.0 - 1.2 mg/dL   Alkaline Phosphatase 49 44 - 121 IU/L   AST 10 0 - 40 IU/L   ALT 11 0 - 32 IU/L  Lipid panel  Result Value Ref Range   Cholesterol, Total 249 (H) 100 - 199 mg/dL   Triglycerides 78 0 - 149 mg/dL   HDL 51 >39 mg/dL   VLDL Cholesterol Cal 13 5 - 40 mg/dL   LDL Chol Calc (NIH) 185 (H) 0 - 99 mg/dL   Chol/HDL Ratio 4.9 (H) 0.0 - 4.4 ratio  TSH  Result Value Ref Range   TSH 1.310 0.450 - 4.500 uIU/mL   Urinalysis, Routine w reflex microscopic  Result Value Ref Range   Specific Gravity, UA 1.015 1.005 - 1.030   pH, UA 7.0 5.0 - 7.5   Color, UA Yellow Yellow   Appearance Ur Clear Clear   Leukocytes,UA Negative Negative   Protein,UA Negative Negative/Trace   Glucose, UA Negative Negative   Ketones, UA Negative Negative   RBC, UA Negative Negative   Bilirubin, UA Negative Negative   Urobilinogen, Ur 0.2 0.2 - 1.0 mg/dL   Nitrite, UA Negative Negative  Hepatitis C Antibody  Result Value Ref Range   Hep C Virus Ab <0.1 0.0 - 0.9 s/co ratio      Assessment & Plan:   Problem List Items Addressed This Visit       Other   Stress - Primary    Chronic.  Controlled.  Continue with current medication regimen on Lexapro 46m.  Return to clinic in 3 months for reevaluation.  Call sooner if concerns arise.          Follow up plan: Return in about 3 months (around 06/29/2021) for Depression/Anxiety FU.   This visit was completed via MyChart due to the restrictions of the COVID-19 pandemic. All issues as above were discussed and addressed. Physical exam was done as above through visual confirmation on MyChart. If it was felt that the patient should be evaluated in the office, they were directed there. The patient verbally consented to this visit. Location of the patient: Home Location of the provider: Office Those involved with this call:  Provider: KJon Billings NP CMA: BYvonna Alanis CAtkinsonDesk/Registration: CMyrlene BrokerThis encounter was conducted via video.  I spent 15 dedicated to the care of this patient on the date of this encounter to include previsit review of 20, face to face time with the patient, and post visit ordering of testing.

## 2021-03-31 ENCOUNTER — Telehealth: Payer: 59 | Admitting: Nurse Practitioner

## 2021-03-31 ENCOUNTER — Encounter: Payer: Self-pay | Admitting: Nurse Practitioner

## 2021-03-31 DIAGNOSIS — F439 Reaction to severe stress, unspecified: Secondary | ICD-10-CM | POA: Diagnosis not present

## 2021-03-31 MED ORDER — ESCITALOPRAM OXALATE 20 MG PO TABS: 20.0000 mg | ORAL_TABLET | Freq: Every day | ORAL | 1 refills | Status: DC

## 2021-03-31 NOTE — Assessment & Plan Note (Signed)
Chronic.  Controlled.  Continue with current medication regimen on Lexapro 20mg .  Return to clinic in 3 months for reevaluation.  Call sooner if concerns arise.

## 2021-05-11 HISTORY — PX: NM RENAL LASIX (ARMC HX): HXRAD1213

## 2021-06-29 NOTE — Progress Notes (Signed)
? ?Breastfeeding No   ? ?Subjective:  ? ? Patient ID: Rebekah Cook, female    DOB: September 25, 1980, 41 y.o.   MRN: [id number] ? ?HPI: ?Rebekah Cook is a 41 y.o. female ? ?Chief Complaint  ?Patient presents with  ? Medication Management  ?  Pt reports she has notied a lack of motivation and lethargic to the point where she needs to nap during the day.   ? ?STRESS ?Patient states she has noticed over the past month or 2 she has been very tired, especially in the mornings.  She is getting 7-8 hours of sleep.  Then around 8-9 am she gets very sleepy and feels like she needs a nap.  She has not been getting restful sleep recently.  Does feel like her stress has been better managed.  ? ?Flowsheet Row Video Visit from 06/30/2021 in [facility]  ?PHQ-9 Total Score 8  ? ?  ? ? ? ?  06/30/2021  ? 11:14 AM 03/31/2021  ? 11:17 AM 02/17/2021  ?  9:50 AM  ?GAD 7 : Generalized Anxiety Score  ?Nervous, Anxious, on Edge 0 1 0  ?Control/stop worrying 0 0 0  ?Worry too much - different things 0 1 0  ?Trouble relaxing 0 0 0  ?Restless 0 0 0  ?Easily annoyed or irritable 1 0 0  ?Afraid - awful might happen 0 0 0  ?Total GAD 7 Score 1 2 0  ?Anxiety Difficulty Not difficult at all Not difficult at all Not difficult at all  ? ? ? ?Relevant past medical, surgical, family and social history reviewed and updated as indicated. Interim medical history since our last visit reviewed. ?Allergies and medications reviewed and updated. ? ?Review of Systems  ?Psychiatric/Behavioral:  Positive for dysphoric mood. Negative for suicidal ideas. The patient is nervous/anxious.   ? ?Per HPI unless specifically indicated above ? ?   ?Objective:  ?  ?Breastfeeding No   ?Wt Readings from Last 3 Encounters:  ?01/17/21 164 lb 6.4 oz (74.6 kg)  ?07/14/20 168 lb 9.6 oz (76.5 kg)  ?10/13/18 195 lb (88.5 kg)  ?  ?Physical Exam ?Vitals and nursing note reviewed.  ?HENT:  ?   Head: Normocephalic.  ?   Right Ear: Hearing normal.  ?   Left Ear: Hearing  normal.  ?   Nose: Nose normal.  ?Eyes:  ?   Pupils: Pupils are equal, round, and reactive to light.  ?Pulmonary:  ?   Effort: Pulmonary effort is normal. No respiratory distress.  ?Neurological:  ?   Mental Status: She is alert.  ?Psychiatric:     ?   Mood and Affect: Mood normal.     ?   Behavior: Behavior normal.     ?   Thought Content: Thought content normal.     ?   Judgment: Judgment normal.  ? ? ?Results for orders placed or performed in visit on 03/31/21  ?HM PAP SMEAR  ?Result Value Ref Range  ? HM Pap smear see report scanned into chart   ? ?   ?Assessment & Plan:  ? ?Problem List Items Addressed This Visit   ? ?  ? Other  ? Stress - Primary  ?  Chronic.  Controlled.  Continue with current medication regimen of Lexapro 20mg .  Labs ordered today.  Return to clinic in 6 weeks for reevaluation.  Call sooner if concerns arise.  ? ? ?  ?  ? ?Other Visit Diagnoses   ? ?  Other fatigue      ? Will check TSH, T4, CBC. If labs are normal will wait 6 weeks to see if symptoms improve. If not improved will change Lexapro to Zoloft of Prozac.  ? Relevant Orders  ? TSH  ? T4, free  ? Anemia Profile B  ? ?  ?  ? ?Follow up plan: ?Return in about 6 weeks (around 08/11/2021) for Depression/Anxiety FU (virtual okay). ? ? ?This visit was completed via MyChart due to the restrictions of the COVID-19 pandemic. All issues as above were discussed and addressed. Physical exam was done as above through visual confirmation on MyChart. If it was felt that the patient should be evaluated in the office, they were directed there. The patient verbally consented to this visit. ?Location of the patient: Home ?Location of the provider: Office ?Those involved with this call:  ?Provider: Larae Grooms, NP ?CMA: Anitra Lauth, CMA ?Front Desk/Registration: Servando Snare ?This encounter was conducted via video.  I spent 30 dedicated to the care of this patient on the date of this encounter to include previsit review of plan of care, labs, plan  for follow up, face to face time with the patient, and post visit ordering of testing.  ? ? ? ? ?

## 2021-06-30 ENCOUNTER — Telehealth: Payer: 59 | Admitting: Nurse Practitioner

## 2021-06-30 ENCOUNTER — Encounter: Payer: Self-pay | Admitting: Nurse Practitioner

## 2021-06-30 DIAGNOSIS — F439 Reaction to severe stress, unspecified: Secondary | ICD-10-CM | POA: Diagnosis not present

## 2021-06-30 DIAGNOSIS — R5383 Other fatigue: Secondary | ICD-10-CM

## 2021-06-30 NOTE — Assessment & Plan Note (Signed)
Chronic.  Controlled.  Continue with current medication regimen of Lexapro 20mg .  Labs ordered today.  Return to clinic in 6 weeks for reevaluation.  Call sooner if concerns arise.  ? ?

## 2021-06-30 NOTE — Progress Notes (Signed)
Appt scheduled

## 2021-07-04 ENCOUNTER — Telehealth: Payer: 59 | Admitting: Nurse Practitioner

## 2021-07-04 DIAGNOSIS — J020 Streptococcal pharyngitis: Secondary | ICD-10-CM | POA: Diagnosis not present

## 2021-07-04 MED ORDER — AZITHROMYCIN 250 MG PO TABS: ORAL_TABLET | ORAL | 0 refills | Status: AC

## 2021-07-04 NOTE — Progress Notes (Signed)
E-Visit for Sore Throat - Strep Symptoms °  °We are sorry that you are not feeling well.  Here is how we plan to help! °  °Based on what you have shared with me it is likely that you have strep pharyngitis.  Strep pharyngitis is inflammation and infection in the back of the throat.  This is an infection cause by bacteria and is treated with antibiotics.  I have prescribed Azithromycin 250 mg two tablets today and then one daily for 4 additional days. For throat pain, we recommend over the counter oral pain relief medications such as acetaminophen or aspirin, or anti-inflammatory medications such as ibuprofen or naproxen sodium. Topical treatments such as oral throat lozenges or sprays may be used as needed. Strep infections are not as easily transmitted as other respiratory infections, however we still recommend that you avoid close contact with loved ones, especially the very young and elderly.  Remember to wash your hands thoroughly throughout the day as this is the number one way to prevent the spread of infection and wipe down door knobs and counters with disinfectant. °  °  °Home Care: °Only take medications as instructed by your medical team. °Complete the entire course of an antibiotic. °Do not take these medications with alcohol. °A steam or ultrasonic humidifier can help congestion.  You can place a towel over your head and breathe in the steam from hot water coming from a faucet. °Avoid close contacts especially the very young and the elderly. °Cover your mouth when you cough or sneeze. °Always remember to wash your hands. °  °Get Help Right Away If: °You develop worsening fever or sinus pain. °You develop a severe head ache or visual changes. °Your symptoms persist after you have completed your treatment plan. °  °Make sure you °Understand these instructions. °Will watch your condition. °Will get help right away if you are not doing well or get worse. °  °  °Thank you for choosing an e-visit. °  °Your  e-visit answers were reviewed by a board certified advanced clinical practitioner to complete your personal care plan. Depending upon the condition, your plan could have included both over the counter or prescription medications. °  °Please review your pharmacy choice. Make sure the pharmacy is open so you can pick up prescription now. If there is a problem, you may contact your provider through MyChart messaging and have the prescription routed to another pharmacy.  Your safety is important to us. If you have drug allergies check your prescription carefully.  °  °For the next 24 hours you can use MyChart to ask questions about today's visit, request a non-urgent call back, or ask for a work or school excuse. °You will get an email in the next two days asking about your experience. I hope that your e-visit has been valuable and will speed your recovery.  ° °I spent approximately 7 minutes reviewing the patient's history, current symptoms and coordinating their plan of care today.   ° °Meds ordered this encounter  °Medications  ° azithromycin (ZITHROMAX) 250 MG tablet  °  Sig: Take 2 tablets on day 1, then 1 tablet daily on days 2 through 5  °  Dispense:  6 tablet  °  Refill:  0  °  °

## 2021-07-05 ENCOUNTER — Telehealth: Payer: Self-pay

## 2021-07-05 MED ORDER — ESCITALOPRAM OXALATE 20 MG PO TABS: 20.0000 mg | ORAL_TABLET | Freq: Every day | ORAL | 1 refills | Status: DC

## 2021-07-05 NOTE — Telephone Encounter (Signed)
Refill sent to the pharmacy 

## 2021-08-03 ENCOUNTER — Telehealth: Payer: 59 | Admitting: Family Medicine

## 2021-08-03 DIAGNOSIS — J029 Acute pharyngitis, unspecified: Secondary | ICD-10-CM

## 2021-08-03 NOTE — Progress Notes (Signed)
[place]   Needs strep testing as was recently treated a month back- desire to not repeat anbx if not needed. In person eval is warranted.  Message with details on why this is needed was sent via this EV.

## 2021-08-11 ENCOUNTER — Telehealth: Payer: 59 | Admitting: Nurse Practitioner

## 2021-08-21 ENCOUNTER — Other Ambulatory Visit: Payer: 59

## 2021-08-21 DIAGNOSIS — R5383 Other fatigue: Secondary | ICD-10-CM

## 2021-08-22 LAB — ANEMIA PROFILE B
Basophils Absolute: 0 10*3/uL (ref 0.0–0.2)
Basos: 0 %
EOS (ABSOLUTE): 0.1 10*3/uL (ref 0.0–0.4)
Eos: 2 %
Ferritin: 53 ng/mL (ref 15–150)
Folate: 17.2 ng/mL (ref >3.0)
Hematocrit: 39.1 % (ref 34.0–46.6)
Hemoglobin: 12.9 g/dL (ref 11.1–15.9)
Immature Grans (Abs): 0 10*3/uL (ref 0.0–0.1)
Immature Granulocytes: 0 %
Iron Saturation: 19 % (ref 15–55)
Iron: 81 ug/dL (ref 27–159)
Lymphocytes Absolute: 2.4 10*3/uL (ref 0.7–3.1)
Lymphs: 36 %
MCH: 28.2 pg (ref 26.6–33.0)
MCHC: 33 g/dL (ref 31.5–35.7)
MCV: 85 fL (ref 79–97)
Monocytes Absolute: 0.3 10*3/uL (ref 0.1–0.9)
Monocytes: 5 %
Neutrophils Absolute: 3.9 10*3/uL (ref 1.4–7.0)
Neutrophils: 57 %
Platelets: 307 10*3/uL (ref 150–450)
RBC: 4.58 x10E6/uL (ref 3.77–5.28)
RDW: 13.1 % (ref 11.7–15.4)
Retic Ct Pct: 2 % (ref 0.6–2.6)
Total Iron Binding Capacity: 422 ug/dL (ref 250–450)
UIBC: 341 ug/dL (ref 131–425)
Vitamin B-12: 274 pg/mL (ref 232–1245)
WBC: 6.8 10*3/uL (ref 3.4–10.8)

## 2021-08-22 LAB — TSH: TSH: 1.85 u[IU]/mL (ref 0.450–4.500)

## 2021-08-22 LAB — T4, FREE: Free T4: 0.89 ng/dL (ref 0.82–1.77)

## 2021-08-22 NOTE — Progress Notes (Signed)
HI Rebekah Cook. It was nice to see you yesterday.  Your lab work looks good.  No concerns at this time. Continue with your current medication regimen.  Follow up as discussed.  Please let me know if you have any questions.

## 2021-08-23 NOTE — Progress Notes (Signed)
There were no vitals taken for this visit.   Subjective:    Patient ID: Rebekah Cook, female    DOB: 01-Oct-1980, 41 y.o.   MRN: [id number]  HPI: Rebekah Cook is a 41 y.o. female  Chief Complaint  Patient presents with   Anxiety   Depression    Patient reports still feeling lethargic and fatigued    STRESS Patient states she has noticed over the past several months.  she has been very tired, especially in the mornings.  She is still requiring naps.  We feel that this may be related to the Lexapro.  Denies SI.    Flowsheet Row Video Visit from 08/24/2021 in [facility]  PHQ-9 Total Score 6          08/24/2021   11:10 AM 06/30/2021   11:14 AM 03/31/2021   11:17 AM 02/17/2021    9:50 AM  GAD 7 : Generalized Anxiety Score  Nervous, Anxious, on Edge 0 0 1 0  Control/stop worrying 0 0 0 0  Worry too much - different things 0 0 1 0  Trouble relaxing 0 0 0 0  Restless 0 0 0 0  Easily annoyed or irritable 0 1 0 0  Afraid - awful might happen 0 0 0 0  Total GAD 7 Score 0 1 2 0  Anxiety Difficulty Not difficult at all Not difficult at all Not difficult at all Not difficult at all     Relevant past medical, surgical, family and social history reviewed and updated as indicated. Interim medical history since our last visit reviewed. Allergies and medications reviewed and updated.  Review of Systems  Psychiatric/Behavioral:  Positive for dysphoric mood. Negative for suicidal ideas. The patient is nervous/anxious.     Per HPI unless specifically indicated above     Objective:    There were no vitals taken for this visit.  Wt Readings from Last 3 Encounters:  01/17/21 164 lb 6.4 oz (74.6 kg)  07/14/20 168 lb 9.6 oz (76.5 kg)  10/13/18 195 lb (88.5 kg)    Physical Exam Vitals and nursing note reviewed.  HENT:     Head: Normocephalic.     Right Ear: Hearing normal.     Left Ear: Hearing normal.     Nose: Nose normal.  Eyes:     Pupils: Pupils  are equal, round, and reactive to light.  Pulmonary:     Effort: Pulmonary effort is normal. No respiratory distress.  Neurological:     Mental Status: She is alert.  Psychiatric:        Mood and Affect: Mood normal.        Behavior: Behavior normal.        Thought Content: Thought content normal.        Judgment: Judgment normal.     Results for orders placed or performed in visit on 08/21/21  Anemia Profile B  Result Value Ref Range   Total Iron Binding Capacity 422 250 - 450 ug/dL   UIBC 409 811 - 914 ug/dL   Iron 81 27 - 782 ug/dL   Iron Saturation 19 15 - 55 %   Ferritin 53 15 - 150 ng/mL   Vitamin B-12 274 232 - 1,245 pg/mL   Folate 17.2 >3.0 ng/mL   WBC 6.8 3.4 - 10.8 x10E3/uL   RBC 4.58 3.77 - 5.28 x10E6/uL   Hemoglobin 12.9 11.1 - 15.9 g/dL   Hematocrit 95.6 21.3 - 46.6 %  MCV 85 79 - 97 fL   MCH 28.2 26.6 - 33.0 pg   MCHC 33.0 31.5 - 35.7 g/dL   RDW 56.4 33.2 - 95.1 %   Platelets 307 150 - 450 x10E3/uL   Neutrophils 57 Not Estab. %   Lymphs 36 Not Estab. %   Monocytes 5 Not Estab. %   Eos 2 Not Estab. %   Basos 0 Not Estab. %   Neutrophils Absolute 3.9 1.4 - 7.0 x10E3/uL   Lymphocytes Absolute 2.4 0.7 - 3.1 x10E3/uL   Monocytes Absolute 0.3 0.1 - 0.9 x10E3/uL   EOS (ABSOLUTE) 0.1 0.0 - 0.4 x10E3/uL   Basophils Absolute 0.0 0.0 - 0.2 x10E3/uL   Immature Granulocytes 0 Not Estab. %   Immature Grans (Abs) 0.0 0.0 - 0.1 x10E3/uL   Retic Ct Pct 2.0 0.6 - 2.6 %  T4, free  Result Value Ref Range   Free T4 0.89 0.82 - 1.77 ng/dL  TSH  Result Value Ref Range   TSH 1.850 0.450 - 4.500 uIU/mL      Assessment & Plan:   Problem List Items Addressed This Visit       Other   Stress - Primary    Chronic. Will change Lexapro to Celexa 10mg  daily due to fatigue.  Side effects and benefits of medication discussed during visit today. Will follow up in 1 month for reevaluation. Call sooner if concerns arise.         Follow up plan: Return in about 1 month  (around 09/23/2021) for Depression/Anxiety FU (virtual).   This visit was completed via MyChart due to the restrictions of the COVID-19 pandemic. All issues as above were discussed and addressed. Physical exam was done as above through visual confirmation on MyChart. If it was felt that the patient should be evaluated in the office, they were directed there. The patient verbally consented to this visit. Location of the patient: Home Location of the provider: Office Those involved with this call:  Provider: 09/25/2021, NP CMA: Larae Grooms, CMA Front Desk/Registration: Anitra Lauth This encounter was conducted via video.  I spent 30 dedicated to the care of this patient on the date of this encounter to include previsit review of plan of care, labs, plan for follow up, face to face time with the patient, and post visit ordering of testing.

## 2021-08-24 ENCOUNTER — Telehealth: Payer: 59 | Admitting: Nurse Practitioner

## 2021-08-24 ENCOUNTER — Encounter: Payer: Self-pay | Admitting: Nurse Practitioner

## 2021-08-24 DIAGNOSIS — F439 Reaction to severe stress, unspecified: Secondary | ICD-10-CM | POA: Diagnosis not present

## 2021-08-24 MED ORDER — CITALOPRAM HYDROBROMIDE 10 MG PO TABS: 10.0000 mg | ORAL_TABLET | Freq: Every day | ORAL | 0 refills | Status: DC

## 2021-08-24 NOTE — Assessment & Plan Note (Signed)
Chronic. Will change Lexapro to Celexa 10mg  daily due to fatigue.  Side effects and benefits of medication discussed during visit today. Will follow up in 1 month for reevaluation. Call sooner if concerns arise.

## 2021-08-24 NOTE — Progress Notes (Signed)
LVM asking patient to call back to schedule follow up appointment  ?

## 2021-08-25 NOTE — Progress Notes (Signed)
2nd attempt to reach patient.

## 2021-09-18 ENCOUNTER — Other Ambulatory Visit: Payer: Self-pay | Admitting: Nurse Practitioner

## 2021-09-19 NOTE — Telephone Encounter (Signed)
Requested Prescriptions  Pending Prescriptions Disp Refills  . citalopram (CELEXA) 10 MG tablet [Pharmacy Med Name: Citalopram Hydrobromide 10 MG Oral Tablet] 30 tablet 0    Sig: Take 1 tablet by mouth once daily     Psychiatry:  Antidepressants - SSRI Passed - 09/18/2021 12:08 PM      Passed - Valid encounter within last 6 months    Recent Outpatient Visits          3 weeks ago Stress   [facility] Larae Grooms, NP   2 months ago Stress   [facility] Larae Grooms, NP   5 months ago Stress   [facility] Larae Grooms, NP   7 months ago Stress   [facility] Larae Grooms, NP   8 months ago Annual physical exam   [facility] Larae Grooms, NP      Future Appointments            In 1 week Larae Grooms, NP [facility]   In 4 months Larae Grooms, NP [facility]

## 2021-09-26 NOTE — Progress Notes (Unsigned)
There were no vitals taken for this visit.   Subjective:    Patient ID: Rebekah Cook, female    DOB: 16-Jan-1981, 41 y.o.   MRN: [id number]  HPI: Rebekah Cook is a 41 y.o. female  No chief complaint on file.  STRESS Patient states she has noticed over the past several months.  she has been very tired, especially in the mornings.  She is still requiring naps.  We feel that this may be related to the Lexapro.  Denies SI.    Flowsheet Row Video Visit from 08/24/2021 in [facility]  PHQ-9 Total Score 6          08/24/2021   11:10 AM 06/30/2021   11:14 AM 03/31/2021   11:17 AM 02/17/2021    9:50 AM  GAD 7 : Generalized Anxiety Score  Nervous, Anxious, on Edge 0 0 1 0  Control/stop worrying 0 0 0 0  Worry too much - different things 0 0 1 0  Trouble relaxing 0 0 0 0  Restless 0 0 0 0  Easily annoyed or irritable 0 1 0 0  Afraid - awful might happen 0 0 0 0  Total GAD 7 Score 0 1 2 0  Anxiety Difficulty Not difficult at all Not difficult at all Not difficult at all Not difficult at all     Relevant past medical, surgical, family and social history reviewed and updated as indicated. Interim medical history since our last visit reviewed. Allergies and medications reviewed and updated.  Review of Systems  Psychiatric/Behavioral:  Positive for dysphoric mood. Negative for suicidal ideas. The patient is nervous/anxious.     Per HPI unless specifically indicated above     Objective:    There were no vitals taken for this visit.  Wt Readings from Last 3 Encounters:  01/17/21 164 lb 6.4 oz (74.6 kg)  07/14/20 168 lb 9.6 oz (76.5 kg)  10/13/18 195 lb (88.5 kg)    Physical Exam Vitals and nursing note reviewed.  HENT:     Head: Normocephalic.     Right Ear: Hearing normal.     Left Ear: Hearing normal.     Nose: Nose normal.  Eyes:     Pupils: Pupils are equal, round, and reactive to light.  Pulmonary:     Effort: Pulmonary effort is normal. No  respiratory distress.  Neurological:     Mental Status: She is alert.  Psychiatric:        Mood and Affect: Mood normal.        Behavior: Behavior normal.        Thought Content: Thought content normal.        Judgment: Judgment normal.    Results for orders placed or performed in visit on 08/21/21  Anemia Profile B  Result Value Ref Range   Total Iron Binding Capacity 422 250 - 450 ug/dL   UIBC 263 785 - 885 ug/dL   Iron 81 27 - 027 ug/dL   Iron Saturation 19 15 - 55 %   Ferritin 53 15 - 150 ng/mL   Vitamin B-12 274 232 - 1,245 pg/mL   Folate 17.2 >3.0 ng/mL   WBC 6.8 3.4 - 10.8 x10E3/uL   RBC 4.58 3.77 - 5.28 x10E6/uL   Hemoglobin 12.9 11.1 - 15.9 g/dL   Hematocrit 74.1 28.7 - 46.6 %   MCV 85 79 - 97 fL   MCH 28.2 26.6 - 33.0 pg   MCHC 33.0 31.5 -  35.7 g/dL   RDW 30.0 92.3 - 30.0 %   Platelets 307 150 - 450 x10E3/uL   Neutrophils 57 Not Estab. %   Lymphs 36 Not Estab. %   Monocytes 5 Not Estab. %   Eos 2 Not Estab. %   Basos 0 Not Estab. %   Neutrophils Absolute 3.9 1.4 - 7.0 x10E3/uL   Lymphocytes Absolute 2.4 0.7 - 3.1 x10E3/uL   Monocytes Absolute 0.3 0.1 - 0.9 x10E3/uL   EOS (ABSOLUTE) 0.1 0.0 - 0.4 x10E3/uL   Basophils Absolute 0.0 0.0 - 0.2 x10E3/uL   Immature Granulocytes 0 Not Estab. %   Immature Grans (Abs) 0.0 0.0 - 0.1 x10E3/uL   Retic Ct Pct 2.0 0.6 - 2.6 %  T4, free  Result Value Ref Range   Free T4 0.89 0.82 - 1.77 ng/dL  TSH  Result Value Ref Range   TSH 1.850 0.450 - 4.500 uIU/mL      Assessment & Plan:   Problem List Items Addressed This Visit   None    Follow up plan: No follow-ups on file.   This visit was completed via MyChart due to the restrictions of the COVID-19 pandemic. All issues as above were discussed and addressed. Physical exam was done as above through visual confirmation on MyChart. If it was felt that the patient should be evaluated in the office, they were directed there. The patient verbally consented to this  visit. Location of the patient: Home Location of the provider: Office Those involved with this call:  Provider: Larae Grooms, NP CMA: Anitra Lauth, CMA Front Desk/Registration: Servando Snare This encounter was conducted via video.  I spent 30 dedicated to the care of this patient on the date of this encounter to include previsit review of plan of care, labs, plan for follow up, face to face time with the patient, and post visit ordering of testing.

## 2021-09-27 ENCOUNTER — Encounter: Payer: Self-pay | Admitting: Nurse Practitioner

## 2021-09-27 ENCOUNTER — Telehealth (INDEPENDENT_AMBULATORY_CARE_PROVIDER_SITE_OTHER): Payer: 59 | Admitting: Nurse Practitioner

## 2021-09-27 DIAGNOSIS — F439 Reaction to severe stress, unspecified: Secondary | ICD-10-CM | POA: Diagnosis not present

## 2021-09-27 DIAGNOSIS — R0683 Snoring: Secondary | ICD-10-CM | POA: Diagnosis not present

## 2021-09-27 MED ORDER — CITALOPRAM HYDROBROMIDE 10 MG PO TABS: 10.0000 mg | ORAL_TABLET | Freq: Every day | ORAL | 1 refills | Status: DC

## 2021-09-27 MED ORDER — MOLNUPIRAVIR EUA 200MG CAPSULE: 4.0000 | ORAL_CAPSULE | Freq: Two times a day (BID) | ORAL | 0 refills | Status: AC

## 2021-09-27 NOTE — Assessment & Plan Note (Signed)
Chronic.  Controlled.  Continue with current medication regimen of Celexa 10mg .  Refill sent today.    Return to clinic in 3 months for reevaluation.  Call sooner if concerns arise.

## 2021-09-27 NOTE — Addendum Note (Signed)
Addended by: Larae Grooms on: 09/27/2021 11:33 AM   Modules accepted: Orders

## 2021-09-28 NOTE — Progress Notes (Signed)
Patient schedule for follow up appointment

## 2021-10-09 ENCOUNTER — Encounter: Payer: Self-pay | Admitting: Nurse Practitioner

## 2021-11-23 ENCOUNTER — Encounter: Payer: Self-pay | Admitting: Neurology

## 2021-11-23 ENCOUNTER — Ambulatory Visit: Payer: 59 | Admitting: Neurology

## 2021-11-23 VITALS — Ht 64.0 in | Wt 190.0 lb

## 2021-11-23 DIAGNOSIS — R635 Abnormal weight gain: Secondary | ICD-10-CM | POA: Diagnosis not present

## 2021-11-23 DIAGNOSIS — G4719 Other hypersomnia: Secondary | ICD-10-CM

## 2021-11-23 DIAGNOSIS — E669 Obesity, unspecified: Secondary | ICD-10-CM

## 2021-11-23 DIAGNOSIS — R0683 Snoring: Secondary | ICD-10-CM

## 2021-11-23 NOTE — Progress Notes (Signed)
Subjective:    Patient ID: Rebekah Cook is a 41 y.o. female.  HPI    Star Age, MD, PhD [facility] [address], [address] [place], [place] [address]  Dear Rebekah Cook,  I saw your patient, Rebekah Cook, upon your kind request in my sleep clinic today for initial consultation of her sleep disorder, in particular, concern for underlying obstructive sleep apnea.  The patient is unaccompanied today.  As you know, Rebekah Cook is a 41 year old right-handed woman with an underlying medical history of reflux disease, PCOS, gestational diabetes, and mild obesity, who reports snoring and excessive daytime somnolence.  I reviewed your virtual visit note from 09/27/2021.  Her Epworth sleepiness score is 12 out of 24, fatigue severity score is 43 out of 63.  She has had difficulty losing weight after her second child.  She has a 40-year-old daughter and 48-year-old son.  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  She lives with husband and kids, her husband is a stay-at-home dad.  She suspects that her father has sleep apnea.  She has had nocturnal reflux symptoms and has started taking Prilosec in the morning.  She is currently on low-dose Celexa at night.  She has a bedtime of around 9 or 10 PM and rise time around 5 AM.  She starts work shortly after 5 AM.  She works till [employer] PM or 3:30 PM.  She has had increasing in her snoring.  She drinks caffeine in the form of hot tea, typically 1 cup, no daily soda or coffee.  She has 3 dogs in the household, none of them sleep in the bedroom with them.  She denies night to night nocturia or recurrent morning headaches.  She has had worsening daytime somnolence of the past nearly 1 year.  She is working on weight loss.  Her Past Medical History Is Significant For: Past Medical History:  Diagnosis Date   Complication of anesthesia    HARD TO WAKE UP FROM WISDOM TEETH   Family history of adverse reaction to anesthesia    MOM-    GERD (gastroesophageal reflux disease)    Gestational diabetes    PCOS (polycystic ovarian syndrome)    PONV (postoperative nausea and vomiting)    WITH PREVIOUS C-SECTION   Vaginal Pap smear, abnormal    2009    Her Past Surgical History Is Significant For: Past Surgical History:  Procedure Laterality Date   CESAREAN SECTION     CESAREAN SECTION N/A 10/13/2018   Procedure: CESAREAN SECTION, REPEAT;  Surgeon: Ward, Honor Loh, MD;  Location: [facility] ORS;  Service: Obstetrics;  Laterality: N/A;   CYST EXCISION     HYSTEROSCOPY Bilateral 2014   MOUTH SURGERY     TEENAGER   NM RENAL LASIX ([place] HX) Bilateral 05/11/2021   WISDOM TOOTH EXTRACTION Bilateral     Her Family History Is Significant For: Family History  Problem Relation Age of Onset   Kidney disease Mother    Cancer Mother        kidney   Miscarriages / [place] Mother    Cancer Father        prostate   Heart disease Father    Parkinson's disease Maternal Aunt    Cancer Paternal Uncle    Cancer Maternal Grandmother    Diabetes Maternal Grandmother    Arthritis Maternal Grandmother    Cancer Maternal Grandfather    Arthritis Maternal Grandfather    Stroke Maternal Grandfather  Diabetes Paternal Grandmother    Stroke Paternal Grandmother    Parkinson's disease Paternal Grandfather    Alzheimer's disease Paternal Grandfather     Her Social History Is Significant For: Social History   Socioeconomic History   Marital status: Married    Spouse name: Corene Cornea   Number of children: Not on file   Years of education: Not on file   Highest education level: Not on file  Occupational History   Occupation: [employer]  Tobacco Use   Smoking status: Never   Smokeless tobacco: Never  Vaping Use   Vaping Use: Never used  Substance and Sexual Activity   Alcohol use: Not Currently    Comment: 1 -2 times yearly   Drug use: Never   Sexual activity: Not Currently    Birth control/protection: Pill  Other Topics Concern    Not on file  Social History Narrative   Caffeine sweet or hot tea one cup daily (10 uz).   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    Married, 2 kids (5 and 49 yr old).   Social Determinants of Health   Financial Resource Strain: Not on file  Food Insecurity: Not on file  Transportation Needs: Not on file  Physical Activity: Not on file  Stress: Not on file  Social Connections: Not on file    Her Allergies Are:  Allergies  Allergen Reactions   Penicillins Swelling    Swelling of gums Did it involve swelling of the face/tongue/throat, SOB, or low BP? No Did it involve sudden or severe rash/hives, skin peeling, or any reaction on the inside of your mouth or nose? No Did you need to seek medical attention at a hospital or doctor's office? No When did it last happen?      2009 If all above answers are "NO", may proceed with cephalosporin use.    Levofloxacin Nausea And Vomiting  :   Her Current Medications Are:  Outpatient Encounter Medications as of 11/23/2021  Medication Sig   citalopram (CELEXA) 10 MG tablet Take 1 tablet (10 mg total) by mouth daily.   JUNEL FE 1.5/30 1.5-30 MG-MCG tablet Take 1 tablet by mouth daily.   Multiple Vitamin (MULTIVITAMIN) tablet Take 1 tablet by mouth daily.   Omeprazole 20 MG TBDD    No facility-administered encounter medications on file as of 11/23/2021.  :   Review of Systems:  Out of a complete 14 point review of systems, all are reviewed and negative with the exception of these symptoms as listed below:   Review of Systems  Neurological:        Fatigue, snoring, wants to r/o OSA.   ESS 12  FSS 43.  No sleep study or cpap.     Objective:  Neurological Exam  Physical Exam Physical Examination:   BMI 32.6  General Examination: The patient is a very pleasant 41 y.o. female in no acute distress. She appears well-developed and well-nourished and well groomed.   HEENT: Normocephalic, atraumatic, pupils are  equal, round and reactive to light, extraocular tracking is good without limitation to gaze excursion or nystagmus noted. Hearing is grossly intact. Face is symmetric with normal facial animation. Speech is clear with no dysarthria noted. There is no hypophonia. There is no lip, neck/head, jaw or voice tremor. Neck is supple with full range of passive and active motion. There are no carotid bruits on auscultation. Oropharynx exam reveals: mild mouth dryness, good dental hygiene and  moderate airway crowding, due to small airway entry, tonsillar size of about 2-3+ on the right and 1-2+ on the left.  Mallampati class II.  Neck circumference of 15-7/8 inches, minimal to mild overbite.  Tongue protrudes centrally and palate elevates symmetrically.   Chest: Clear to auscultation without wheezing, rhonchi or crackles noted.  Heart: S1+S2+0, regular and normal without murmurs, rubs or gallops noted.   Abdomen: Soft, non-tender and non-distended.  Extremities: There is no pitting edema in the distal lower extremities bilaterally.   Skin: Warm and dry without trophic changes noted.   Musculoskeletal: exam reveals no obvious joint deformities.   Neurologically:  Mental status: The patient is awake, alert and oriented in all 4 spheres. Her immediate and remote memory, attention, language skills and fund of knowledge are appropriate. There is no evidence of aphasia, agnosia, apraxia or anomia. Speech is clear with normal prosody and enunciation. Thought process is linear. Mood is normal and affect is normal.  Cranial nerves II - XII are as described above under HEENT exam.  Motor exam: Normal bulk, strength and tone is noted. There is no obvious tremor. Fine motor skills and coordination: grossly intact.  Cerebellar testing: No dysmetria or intention tremor. There is no truncal or gait ataxia.  Sensory exam: intact to light touch in the upper and lower extremities.  Gait, station and balance: She stands  easily. No veering to one side is noted. No leaning to one side is noted. Posture is age-appropriate and stance is narrow based. Gait shows normal stride length and normal pace. No problems turning are noted.   Assessment and plan:  In summary, Rebekah Cook is a very pleasant 41 y.o.-year old female with an underlying medical history of reflux disease, PCOS, gestational diabetes, and mild obesity, whose history and physical exam are concerning for sleep disordered breathing, supporting a current working diagnosis of unspecified sleep apnea, with the main differential diagnoses of obstructive sleep apnea (OSA) versus upper airway resistance syndrome (UARS) versus central sleep apnea (CSA), or mixed sleep apnea. A laboratory attended sleep study is considered gold standard for evaluation of sleep disordered breathing and is recommended at this time and clinically justified.   I had a long chat with the patient about my findings and the diagnosis of sleep apnea, particularly OSA, its prognosis and treatment options. We talked about medical/conservative treatments, surgical interventions and non-pharmacological approaches for symptom control. I explained, in particular, the risks and ramifications of untreated moderate to severe OSA, especially with respect to developing cardiovascular disease down the road, including congestive heart failure (CHF), difficult to treat hypertension, cardiac arrhythmias (particularly A-fib), neurovascular complications including TIA, stroke and dementia. Even type 2 diabetes has, in part, been linked to untreated OSA. Symptoms of untreated OSA may include (but may not be limited to) daytime sleepiness, nocturia (i.e. frequent nighttime urination), memory problems, mood irritability and suboptimally controlled or worsening mood disorder such as depression and/or anxiety, lack of energy, lack of motivation, physical discomfort, as well as recurrent headaches, especially morning  or nocturnal headaches. We talked about the importance of maintaining a healthy lifestyle and striving for healthy weight. In addition, we talked about the importance of striving for and maintaining good sleep hygiene. I recommended the following at this time: sleep study.  I outlined the differences between a laboratory attended sleep study which is considered more comprehensive and accurate over the option of a home sleep test (HST); the latter may lead to underestimation of sleep disordered breathing  in some instances and does not help with diagnosing upper airway resistance syndrome and is not accurate enough to diagnose primary central sleep apnea typically. I explained the different sleep test procedures to the patient in detail and also outlined possible surgical and non-surgical treatment options of OSA, including the use of a pressure airway pressure (PAP) device (ie CPAP, AutoPAP/APAP or BiPAP in certain circumstances), a custom-made dental device (aka oral appliance, which would require a referral to a specialist dentist or orthodontist typically, and is generally speaking not considered a good choice for patients with full dentures or edentulous state), upper airway surgical options, such as traditional UPPP (which is not considered a first-line treatment) or the Inspire device (hypoglossal nerve stimulator, which would involve a referral for consultation with an ENT surgeon, after careful selection, following inclusion criteria). I explained the PAP treatment option to the patient in detail, as this is generally considered first-line treatment.  The patient indicated that she would be willing to try PAP therapy, if the need arises. I explained the importance of being compliant with PAP treatment, not only for insurance purposes but primarily to improve patient's symptoms symptoms, and for the patient's long term health benefit, including to reduce Her cardiovascular risks longer-term.    We will  pick up our discussion about the next steps and treatment options after testing.  We will keep her posted as to the test results by phone call and/or MyChart messaging where possible.  We will plan to follow-up in sleep clinic accordingly as well.  I answered all her questions today and the patient was in agreement.   I encouraged her to call with any interim questions, concerns, problems or updates or email [place] through [place].  Generally speaking, sleep test authorizations may take up to 2 weeks, sometimes less, sometimes longer, the patient is encouraged to get in touch with [place] if they do not hear back from the sleep lab staff directly within the next 2 weeks.  Thank you very much for allowing me to participate in the care of this nice patient. If I can be of any further assistance to you please do not hesitate to call me at [phone].  Sincerely,   Star Age, MD, PhD

## 2021-11-23 NOTE — Patient Instructions (Signed)

## 2021-12-27 ENCOUNTER — Telehealth: Payer: Self-pay | Admitting: Neurology

## 2021-12-27 NOTE — Telephone Encounter (Signed)
[employer] pending uploaded notes on the portal  

## 2021-12-27 NOTE — Progress Notes (Unsigned)
Wt 185 lb (83.9 kg) Comment: Patient reports  BMI 31.76 kg/m    Subjective:    Patient ID: Rebekah Cook, female    DOB: [birth year], 41 y.o.   MRN: [id number]  HPI: Rebekah Cook is a 41 y.o. female  Chief Complaint  Patient presents with   Anxiety   Depression    3 month follow up    STRESS Patient states she feels like things have been going pretty well.  She feels like the Celexa is working well for her.  Still has the fatigue that she was experiencing prior.  She is working on getting a sleep study with the sleep center. Denies SI.  Flowsheet Row Video Visit from 12/28/2021 in [facility]  PHQ-9 Total Score 1          12/28/2021   10:35 AM 09/27/2021    8:10 AM 08/24/2021   11:10 AM 06/30/2021   11:14 AM  GAD 7 : Generalized Anxiety Score  Nervous, Anxious, on Edge 0 0 0 0  Control/stop worrying 0 0 0 0  Worry too much - different things 0 0 0 0  Trouble relaxing 0 0 0 0  Restless 0 0 0 0  Easily annoyed or irritable 0 0 0 1  Afraid - awful might happen 0 0 0 0  Total GAD 7 Score 0 0 0 1  Anxiety Difficulty Not difficult at all Not difficult at all Not difficult at all Not difficult at all     Relevant past medical, surgical, family and social history reviewed and updated as indicated. Interim medical history since our last visit reviewed. Allergies and medications reviewed and updated.  Review of Systems  Constitutional:  Positive for fatigue.  Psychiatric/Behavioral:  Negative for dysphoric mood and suicidal ideas. The patient is nervous/anxious.     Per HPI unless specifically indicated above     Objective:    Wt 185 lb (83.9 kg) Comment: Patient reports  BMI 31.76 kg/m   Wt Readings from Last 3 Encounters:  12/28/21 185 lb (83.9 kg)  11/23/21 190 lb (86.2 kg)  01/17/21 164 lb 6.4 oz (74.6 kg)    Physical Exam Vitals and nursing note reviewed.  HENT:     Head: Normocephalic.     Right Ear: Hearing normal.     Left  Ear: Hearing normal.     Nose: Nose normal.  Eyes:     Pupils: Pupils are equal, round, and reactive to light.  Pulmonary:     Effort: Pulmonary effort is normal. No respiratory distress.  Neurological:     Mental Status: She is alert.  Psychiatric:        Mood and Affect: Mood normal.        Behavior: Behavior normal.        Thought Content: Thought content normal.        Judgment: Judgment normal.     Results for orders placed or performed in visit on 08/21/21  Anemia Profile B  Result Value Ref Range   Total Iron Binding Capacity 422 250 - 450 ug/dL   UIBC 756 433 - 295 ug/dL   Iron 81 27 - 188 ug/dL   Iron Saturation 19 15 - 55 %   Ferritin 53 15 - 150 ng/mL   Vitamin B-12 274 232 - 1,245 pg/mL   Folate 17.2 >3.0 ng/mL   WBC 6.8 3.4 - 10.8 x10E3/uL   RBC 4.58 3.77 - 5.28 x10E6/uL   Hemoglobin  12.9 11.1 - 15.9 g/dL   Hematocrit 39.1 34.0 - 46.6 %   MCV 85 79 - 97 fL   MCH 28.2 26.6 - 33.0 pg   MCHC 33.0 31.5 - 35.7 g/dL   RDW 13.1 11.7 - 15.4 %   Platelets 307 150 - 450 x10E3/uL   Neutrophils 57 Not Estab. %   Lymphs 36 Not Estab. %   Monocytes 5 Not Estab. %   Eos 2 Not Estab. %   Basos 0 Not Estab. %   Neutrophils Absolute 3.9 1.4 - 7.0 x10E3/uL   Lymphocytes Absolute 2.4 0.7 - 3.1 x10E3/uL   Monocytes Absolute 0.3 0.1 - 0.9 x10E3/uL   EOS (ABSOLUTE) 0.1 0.0 - 0.4 x10E3/uL   Basophils Absolute 0.0 0.0 - 0.2 x10E3/uL   Immature Granulocytes 0 Not Estab. %   Immature Grans (Abs) 0.0 0.0 - 0.1 x10E3/uL   Retic Ct Pct 2.0 0.6 - 2.6 %  T4, free  Result Value Ref Range   Free T4 0.89 0.82 - 1.77 ng/dL  TSH  Result Value Ref Range   TSH 1.850 0.450 - 4.500 uIU/mL      Assessment & Plan:   Problem List Items Addressed This Visit       Other   Stress - Primary     Follow up plan: No follow-ups on file.   This visit was completed via MyChart due to the restrictions of the COVID-19 pandemic. All issues as above were discussed and addressed. Physical exam  was done as above through visual confirmation on MyChart. If it was felt that the patient should be evaluated in the office, they were directed there. The patient verbally consented to this visit. Location of the patient: Home Location of the provider: Office Those involved with this call:  Provider: Jon Billings, NP CMA: Valinda Hoar, [place] Desk/Registration: Lynnell Catalan This encounter was conducted via video.  I spent 20 dedicated to the care of this patient on the date of this encounter to include previsit review of plan of care, labs, plan for follow up, face to face time with the patient, and post visit ordering of testing.

## 2021-12-28 ENCOUNTER — Encounter: Payer: Self-pay | Admitting: Nurse Practitioner

## 2021-12-28 ENCOUNTER — Telehealth: Payer: 59 | Admitting: Nurse Practitioner

## 2021-12-28 VITALS — Wt 185.0 lb

## 2021-12-28 DIAGNOSIS — F439 Reaction to severe stress, unspecified: Secondary | ICD-10-CM

## 2021-12-28 NOTE — Progress Notes (Signed)
Pt scheduled  

## 2021-12-28 NOTE — Assessment & Plan Note (Signed)
Chronic.  Controlled.  Continue with current medication regimen of Celexa 10mg .  Return to clinic in 3 months for reevaluation.  Call sooner if concerns arise.

## 2022-01-01 NOTE — Telephone Encounter (Signed)
Checked status it is still pending.  

## 2022-01-08 NOTE — Telephone Encounter (Signed)
Checked the status it is still pending  

## 2022-01-17 ENCOUNTER — Ambulatory Visit: Payer: 59 | Admitting: Neurology

## 2022-01-17 ENCOUNTER — Encounter: Payer: 59 | Admitting: Nurse Practitioner

## 2022-01-17 DIAGNOSIS — G4719 Other hypersomnia: Secondary | ICD-10-CM

## 2022-01-17 DIAGNOSIS — R635 Abnormal weight gain: Secondary | ICD-10-CM

## 2022-01-17 DIAGNOSIS — R0683 Snoring: Secondary | ICD-10-CM

## 2022-01-17 DIAGNOSIS — G4733 Obstructive sleep apnea (adult) (pediatric): Secondary | ICD-10-CM | POA: Diagnosis not present

## 2022-01-17 DIAGNOSIS — E669 Obesity, unspecified: Secondary | ICD-10-CM

## 2022-01-17 NOTE — Telephone Encounter (Signed)
[employer] denied NPSG.  MAIL OUT HST no auth req'd(insurance denied inlab request)   Mail out is scheduled for 01/17/22.

## 2022-01-24 ENCOUNTER — Telehealth: Payer: Self-pay | Admitting: Neurology

## 2022-01-24 NOTE — Addendum Note (Signed)
Addended by: Huston Foley on: 01/24/2022 01:34 PM   Modules accepted: Orders

## 2022-01-24 NOTE — Telephone Encounter (Signed)
-----   Message from Huston Foley, MD sent at 01/24/2022  1:34 PM EST ----- Patient referred by PCP, seen by me on 11/23/2021, patient had a HST on 01/19/2022.    Please call and notify the patient that the recent home sleep test showed obstructive sleep apnea in the severe range. I recommend treatment for this in the form of autoPAP, which means, that we don't have to bring her in for a sleep study with CPAP, but will let her start using a so called autoPAP machine at home, through a DME company (of her choice, or as per insurance requirement). The DME representative will fit the patient with a mask of choice, educate her on how to use the machine, how to put the mask on, etc. I have placed an order in the chart. Please send the order to a local DME, talk to patient, send report to referring MD. Please also reinforce the need for compliance with treatment. We will need a FU in sleep clinic for 10 weeks post-PAP set up, please arrange that with me or one of our NPs. Thanks,   Huston Foley, MD, PhD [facility])

## 2022-01-24 NOTE — Procedures (Signed)
   [facility]  HOME SLEEP TEST (Watch PAT) REPORT, mail-out device  STUDY DATE: 01/19/22  DOB: 14-Aug-1980  MRN: [id number]  ORDERING CLINICIAN: Huston Foley, MD, PhD   REFERRING CLINICIAN: Larae Grooms, NP   CLINICAL INFORMATION/HISTORY: 41 year old female with a history of reflux disease, PCOS, gestational diabetes, and mild obesity, who reports snoring and excessive daytime somnolence.     Epworth sleepiness score: 12/24.  BMI: 32.4 kg/m  FINDINGS:   Sleep Summary:   Total Recording Time (hours, min): 9h, 59 min  Total Sleep Time (hours, min):  9 hours, 4 min  Percent REM (%):    26.2%   Respiratory Indices:   Calculated pAHI (per hour):  37.8/hour         REM pAHI:    19.5/hour       NREM pAHI: 39.4/hour  Central pAHI: 0.8/hour  Oxygen Saturation Statistics:    Oxygen Saturation (%) Mean: 95%   Minimum oxygen saturation (%):                 89%   O2 Saturation Range (%): 89 - 99%    O2 Saturation (minutes) <=88%: 0 min  Pulse Rate Statistics:   Pulse Mean (bpm):    82/min    Pulse Range (59 - 131/min)   IMPRESSION: OSA (obstructive sleep apnea)   RECOMMENDATION:  This home sleep test demonstrates severe obstructive sleep apnea - by number of events - with a total AHI of 37.8/hour and O2 nadir of 89%.  Snoring was detected, in the moderate to loud range.  Treatment with positive airway pressure is recommended. The patient will be advised to proceed with an autoPAP titration/trial at home.  A laboratory attended titration study can be considered in the future for optimization of his treatment and better tolerance of therapy.  Alternative treatment options are limited secondary to the severity of the patient's sleep disordered breathing, but may include surgical treatment with Inspire, hypoglossal nerve stimulator, in carefully selected candidates meeting criteria.  Concomitant weight loss is recommended, where clinically  appropriate. Please note, that untreated obstructive sleep apnea may carry additional perioperative morbidity. Patients with significant obstructive sleep apnea should receive perioperative PAP therapy and the surgeons and particularly the anesthesiologist should be informed of the diagnosis and the severity of the sleep disordered breathing. The patient should be cautioned not to drive, work at heights, or operate dangerous or heavy equipment when tired or sleepy. Review and reiteration of good sleep hygiene measures should be pursued with any patient. Other causes of the patient's symptoms, including circadian rhythm disturbances, an underlying mood disorder, medication effect and/or an underlying medical problem cannot be ruled out based on this test. Clinical correlation is recommended.  The patient and her referring provider will be notified of the test results. The patient will be seen in follow up in sleep clinic at [facility].  I certify that I have reviewed the raw data recording prior to the issuance of this report in accordance with the standards of the American Academy of Sleep Medicine (AASM).  INTERPRETING PHYSICIAN:   Huston Foley, MD, PhD  Board Certified in Neurology and Sleep Medicine  [facility] [address] [place], [place] [address] [phone]

## 2022-01-24 NOTE — Telephone Encounter (Signed)
Called patient to discuss sleep study results. No answer at this time. LVM for the patient to call back.   

## 2022-01-24 NOTE — Telephone Encounter (Signed)
Pt is returning a call from nurse. Requesting a call back from nurse. 

## 2022-01-25 ENCOUNTER — Encounter: Payer: Self-pay | Admitting: Neurology

## 2022-01-25 NOTE — Telephone Encounter (Signed)
Called the pt back (2nd attempt). There was no answer LVM advising the patient I will send a mychart message if easier to review that. Advised she can reply to that or call back.

## 2022-02-20 ENCOUNTER — Telehealth: Payer: Self-pay | Admitting: Neurology

## 2022-02-20 NOTE — Telephone Encounter (Addendum)
Pt was scheduled for her initial CPAP on 05-07-22 Pt was informed to bring machine and power cord to the appointment.   DME and between dates are in pt's SnapShot.

## 2022-03-30 ENCOUNTER — Encounter: Payer: Self-pay | Admitting: Nurse Practitioner

## 2022-03-30 ENCOUNTER — Other Ambulatory Visit (HOSPITAL_COMMUNITY)
Admission: RE | Admit: 2022-03-30 | Discharge: 2022-03-30 | Disposition: A | Discharge status: Home / Self Care | Payer: 59 | Source: Ambulatory Visit | Attending: Nurse Practitioner | Admitting: Nurse Practitioner

## 2022-03-30 ENCOUNTER — Ambulatory Visit (INDEPENDENT_AMBULATORY_CARE_PROVIDER_SITE_OTHER): Payer: 59 | Admitting: Nurse Practitioner

## 2022-03-30 VITALS — BP 135/79 | HR 93 | Temp 98.2°F | Ht 64.5 in | Wt 202.7 lb

## 2022-03-30 DIAGNOSIS — Z0000 Encounter for general adult medical examination without abnormal findings: Secondary | ICD-10-CM | POA: Insufficient documentation

## 2022-03-30 DIAGNOSIS — F439 Reaction to severe stress, unspecified: Secondary | ICD-10-CM | POA: Diagnosis not present

## 2022-03-30 DIAGNOSIS — Z1231 Encounter for screening mammogram for malignant neoplasm of breast: Secondary | ICD-10-CM

## 2022-03-30 DIAGNOSIS — E7800 Pure hypercholesterolemia, unspecified: Secondary | ICD-10-CM | POA: Insufficient documentation

## 2022-03-30 DIAGNOSIS — E669 Obesity, unspecified: Secondary | ICD-10-CM | POA: Diagnosis not present

## 2022-03-30 LAB — URINALYSIS, ROUTINE W REFLEX MICROSCOPIC
Bilirubin, UA: NEGATIVE
Glucose, UA: NEGATIVE
Ketones, UA: NEGATIVE
Leukocytes,UA: NEGATIVE
Nitrite, UA: NEGATIVE
Protein,UA: NEGATIVE
RBC, UA: NEGATIVE
Specific Gravity, UA: 1.02 (ref 1.005–1.030)
Urobilinogen, Ur: 0.2 mg/dL (ref 0.2–1.0)
pH, UA: 7 (ref 5.0–7.5)

## 2022-03-30 MED ORDER — WEGOVY 0.25 MG/0.5ML ~~LOC~~ SOAJ: 0.2500 mg | SUBCUTANEOUS | 0 refills | Status: DC

## 2022-03-30 MED ORDER — CITALOPRAM HYDROBROMIDE 10 MG PO TABS: 10.0000 mg | ORAL_TABLET | Freq: Every day | ORAL | 1 refills | Status: DC

## 2022-03-30 MED ORDER — NORETHIN ACE-ETH ESTRAD-FE 1.5-30 MG-MCG PO TABS: 1.0000 | ORAL_TABLET | Freq: Every day | ORAL | 3 refills | Status: DC

## 2022-03-30 NOTE — Progress Notes (Signed)
BP 135/79   Pulse 93   Temp 98.2 F (36.8 C) (Oral)   Ht 5' 4.5" (1.638 m)   Wt 202 lb 11.2 oz (91.9 kg)   LMP  (LMP Unknown)   SpO2 97%   BMI 34.26 kg/m    Subjective:    Patient ID: Rebekah Cook, female    DOB: 1980/08/13, 42 y.o.   MRN: [id number]  HPI: Rebekah Cook is a 42 y.o. female presenting on 03/30/2022 for comprehensive medical examination. Current medical complaints include: weight gain  She currently lives with: Menopausal Symptoms: no  WEIGHT GAIN Duration: since May  Previous attempts at weight loss: yes Complications of obesity: GERD Peak weight: 202lb Weight loss goal: 150lb Weight loss to date: 6 months Requesting obesity pharmacotherapy: yes Current weight loss supplements/medications: no Previous weight loss supplements/meds: no   Denies HA, CP, SOB, dizziness, palpitations, visual changes, and lower extremity swelling.  MOOD Patient states she feels like her mood has been pretty good.  Does get down on herself due to her weight.  Denies concerns at visit today.  Denies SI.   Depression Screen done today and results listed below:     03/30/2022   10:02 AM 12/28/2021   10:33 AM 09/27/2021    8:09 AM 08/24/2021   11:08 AM 06/30/2021   11:12 AM  Depression screen PHQ 2/9  Decreased Interest 0 0 0 0 1  Down, Depressed, Hopeless 0 0 0 0 0  PHQ - 2 Score 0 0 0 0 1  Altered sleeping 0 0 3 3 3   Tired, decreased energy 1 1 3 3 3   Change in appetite 0 0 0 0 1  Feeling bad or failure about yourself  0 0 0 0 0  Trouble concentrating 0 0 0 0 0  Moving slowly or fidgety/restless 0 0 0 0 0  Suicidal thoughts 0 0 0  0  PHQ-9 Score 1 1 6 6 8   Difficult doing work/chores Not difficult at all Not difficult at all Not difficult at all Somewhat difficult Somewhat difficult    The patient does not have a history of falls. I did complete a risk assessment for falls. A plan of care for falls was documented.   Past Medical History:  Past Medical  History:  Diagnosis Date   Complication of anesthesia    HARD TO WAKE UP FROM WISDOM TEETH   Family history of adverse reaction to anesthesia    MOM-   GERD (gastroesophageal reflux disease)    Gestational diabetes    PCOS (polycystic ovarian syndrome)    PONV (postoperative nausea and vomiting)    WITH PREVIOUS C-SECTION   Vaginal Pap smear, abnormal    2009    Surgical History:  Past Surgical History:  Procedure Laterality Date   CESAREAN SECTION     CESAREAN SECTION N/A 10/13/2018   Procedure: CESAREAN SECTION, REPEAT;  Surgeon: Ward, Honor Loh, MD;  Location: [facility] ORS;  Service: Obstetrics;  Laterality: N/A;   CYST EXCISION     HYSTEROSCOPY Bilateral 2014   MOUTH SURGERY     TEENAGER   NM RENAL LASIX ([place] HX) Bilateral 05/11/2021   WISDOM TOOTH EXTRACTION Bilateral     Medications:  Current Outpatient Medications on File Prior to Visit  Medication Sig   Multiple Vitamin (MULTIVITAMIN) tablet Take 1 tablet by mouth daily.   Omeprazole 20 MG TBDD Take 20 mg by mouth daily.   No current facility-administered medications on file prior to  visit.    Allergies:  Allergies  Allergen Reactions   Penicillins Swelling    Swelling of gums Did it involve swelling of the face/tongue/throat, SOB, or low BP? No Did it involve sudden or severe rash/hives, skin peeling, or any reaction on the inside of your mouth or nose? No Did you need to seek medical attention at a hospital or doctor's office? No When did it last happen?      2009 If all above answers are "NO", may proceed with cephalosporin use.    Levofloxacin Nausea And Vomiting    Social History:  Social History   Socioeconomic History   Marital status: Married    Spouse name: Corene Cornea   Number of children: Not on file   Years of education: Not on file   Highest education level: Not on file  Occupational History   Occupation: [employer]  Tobacco Use   Smoking status: Never   Smokeless tobacco: Never  Vaping Use    Vaping Use: Never used  Substance and Sexual Activity   Alcohol use: Not Currently    Comment: 1 -2 times yearly   Drug use: Never   Sexual activity: Not Currently    Birth control/protection: Pill  Other Topics Concern   Not on file  Social History Narrative   Caffeine sweet or hot tea one cup daily (10 uz).   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    Married, 2 kids (91 and 77 yr old).   Social Determinants of Health   Financial Resource Strain: Not on file  Food Insecurity: Not on file  Transportation Needs: Not on file  Physical Activity: Not on file  Stress: Not on file  Social Connections: Not on file  Intimate Partner Violence: Not on file   Social History   Tobacco Use  Smoking Status Never  Smokeless Tobacco Never   Social History   Substance and Sexual Activity  Alcohol Use Not Currently   Comment: 1 -2 times yearly    Family History:  Family History  Problem Relation Age of Onset   Kidney disease Mother    Cancer Mother        kidney   Miscarriages / [place] Mother    Cancer Father        prostate   Heart disease Father    Parkinson's disease Maternal Aunt    Cancer Paternal Uncle    Cancer Maternal Grandmother    Diabetes Maternal Grandmother    Arthritis Maternal Grandmother    Cancer Maternal Grandfather    Arthritis Maternal Grandfather    Stroke Maternal Grandfather    Diabetes Paternal Grandmother    Stroke Paternal Grandmother    Parkinson's disease Paternal Grandfather    Alzheimer's disease Paternal Grandfather     Past medical history, surgical history, medications, allergies, family history and social history reviewed with patient today and changes made to appropriate areas of the chart.   Review of Systems  Constitutional:        Weight gain  Eyes:  Negative for blurred vision and double vision.  Respiratory:  Negative for shortness of breath.   Cardiovascular:  Negative for chest pain, palpitations  and leg swelling.  Neurological:  Negative for dizziness and headaches.  Psychiatric/Behavioral:         Stress   All other ROS negative except what is listed above and in the HPI.      Objective:    BP  135/79   Pulse 93   Temp 98.2 F (36.8 C) (Oral)   Ht 5' 4.5" (1.638 m)   Wt 202 lb 11.2 oz (91.9 kg)   LMP  (LMP Unknown)   SpO2 97%   BMI 34.26 kg/m   Wt Readings from Last 3 Encounters:  03/30/22 202 lb 11.2 oz (91.9 kg)  12/28/21 185 lb (83.9 kg)  11/23/21 190 lb (86.2 kg)    Physical Exam Vitals and nursing note reviewed. Exam conducted with a chaperone present Yvonna Alanis CMA).  Constitutional:      General: She is awake. She is not in acute distress.    Appearance: Normal appearance. She is well-developed. She is obese. She is not ill-appearing.  HENT:     Head: Normocephalic and atraumatic.     Right Ear: Hearing, tympanic membrane, ear canal and external ear normal. No drainage.     Left Ear: Hearing, tympanic membrane, ear canal and external ear normal. No drainage.     Nose: Nose normal.     Right Sinus: No maxillary sinus tenderness or frontal sinus tenderness.     Left Sinus: No maxillary sinus tenderness or frontal sinus tenderness.     Mouth/Throat:     Mouth: Mucous membranes are moist.     Pharynx: Oropharynx is clear. Uvula midline. No pharyngeal swelling, oropharyngeal exudate or posterior oropharyngeal erythema.  Eyes:     General: Lids are normal.        Right eye: No discharge.        Left eye: No discharge.     Extraocular Movements: Extraocular movements intact.     Conjunctiva/sclera: Conjunctivae normal.     Pupils: Pupils are equal, round, and reactive to light.     Visual Fields: Right eye visual fields normal and left eye visual fields normal.  Neck:     Thyroid: No thyromegaly.     Vascular: No carotid bruit.     Trachea: Trachea normal.  Cardiovascular:     Rate and Rhythm: Normal rate and regular rhythm.     Heart sounds:  Normal heart sounds. No murmur heard.    No gallop.  Pulmonary:     Effort: Pulmonary effort is normal. No accessory muscle usage or respiratory distress.     Breath sounds: Normal breath sounds.  Chest:  Breasts:    Right: Normal.     Left: Normal.  Abdominal:     General: Bowel sounds are normal.     Palpations: Abdomen is soft. There is no hepatomegaly or splenomegaly.     Tenderness: There is no abdominal tenderness.  Genitourinary:    Vagina: Normal.     Cervix: Normal.     Adnexa: Right adnexa normal and left adnexa normal.  Musculoskeletal:        General: Normal range of motion.     Cervical back: Normal range of motion and neck supple.     Right lower leg: No edema.     Left lower leg: No edema.  Lymphadenopathy:     Head:     Right side of head: No submental, submandibular, tonsillar, preauricular or posterior auricular adenopathy.     Left side of head: No submental, submandibular, tonsillar, preauricular or posterior auricular adenopathy.     Cervical: No cervical adenopathy.     Upper Body:     Right upper body: No supraclavicular, axillary or pectoral adenopathy.     Left upper body: No supraclavicular, axillary or pectoral adenopathy.  Skin:  General: Skin is warm and dry.     Capillary Refill: Capillary refill takes less than 2 seconds.     Findings: No rash.  Neurological:     Mental Status: She is alert and oriented to person, place, and time.     Gait: Gait is intact.     Deep Tendon Reflexes: Reflexes are normal and symmetric.     Reflex Scores:      Brachioradialis reflexes are 2+ on the right side and 2+ on the left side.      Patellar reflexes are 2+ on the right side and 2+ on the left side. Psychiatric:        Attention and Perception: Attention normal.        Mood and Affect: Mood normal.        Speech: Speech normal.        Behavior: Behavior normal. Behavior is cooperative.        Thought Content: Thought content normal.        Judgment:  Judgment normal.     Results for orders placed or performed in visit on 08/21/21  Anemia Profile B  Result Value Ref Range   Total Iron Binding Capacity 422 250 - 450 ug/dL   UIBC 341 131 - 425 ug/dL   Iron 81 27 - 159 ug/dL   Iron Saturation 19 15 - 55 %   Ferritin 53 15 - 150 ng/mL   Vitamin B-12 274 232 - 1,245 pg/mL   Folate 17.2 >3.0 ng/mL   WBC 6.8 3.4 - 10.8 x10E3/uL   RBC 4.58 3.77 - 5.28 x10E6/uL   Hemoglobin 12.9 11.1 - 15.9 g/dL   Hematocrit 39.1 34.0 - 46.6 %   MCV 85 79 - 97 fL   MCH 28.2 26.6 - 33.0 pg   MCHC 33.0 31.5 - 35.7 g/dL   RDW 13.1 11.7 - 15.4 %   Platelets 307 150 - 450 x10E3/uL   Neutrophils 57 Not Estab. %   Lymphs 36 Not Estab. %   Monocytes 5 Not Estab. %   Eos 2 Not Estab. %   Basos 0 Not Estab. %   Neutrophils Absolute 3.9 1.4 - 7.0 x10E3/uL   Lymphocytes Absolute 2.4 0.7 - 3.1 x10E3/uL   Monocytes Absolute 0.3 0.1 - 0.9 x10E3/uL   EOS (ABSOLUTE) 0.1 0.0 - 0.4 x10E3/uL   Basophils Absolute 0.0 0.0 - 0.2 x10E3/uL   Immature Granulocytes 0 Not Estab. %   Immature Grans (Abs) 0.0 0.0 - 0.1 x10E3/uL   Retic Ct Pct 2.0 0.6 - 2.6 %  T4, free  Result Value Ref Range   Free T4 0.89 0.82 - 1.77 ng/dL  TSH  Result Value Ref Range   TSH 1.850 0.450 - 4.500 uIU/mL      Assessment & Plan:   Problem List Items Addressed This Visit       Other   Obesity (BMI 30-39.9)    Chronic. BMI 34.2.  Will start Wegovy.  Side effects and benefits of medication discussed during visit. Discussed how to inject medication during visit.  Discussed titrating medication to aid in weight loss.  Recommended eating smaller high protein, low fat meals more frequently and exercising 30 mins a day 5 times a week with a goal of 10-15lb weight loss in the next 3 months. Patient voiced their understanding and motivation to adhere to these recommendations.       Relevant Medications   Semaglutide-Weight Management (WEGOVY) 0.25 MG/0.5ML SOAJ   Stress  Chronic.   Controlled.  Continue with current medication regimen of Celexa 10mg .  Return to clinic in 5 months for reevaluation.  Call sooner if concerns arise.        Hypercholesteremia    Chronic.  Controlled.  Continue with current medication regimen.  Labs ordered today.  Return to clinic in 6 months for reevaluation.  Call sooner if concerns arise.        Relevant Orders   Lipid panel   Other Visit Diagnoses     Annual physical exam    -  Primary   Health maintenance reviewed during visit today.  Labs ordered.  Vaccines up to date.  PAP done today.   Relevant Orders   CBC with Differential/Platelet   Comprehensive metabolic panel   Lipid panel   TSH   Urinalysis, Routine w reflex microscopic   Cytology - PAP   Encounter for screening mammogram for malignant neoplasm of breast       Relevant Orders   MM 3D SCREEN BREAST BILATERAL        Follow up plan: Return in about 6 weeks (around 05/11/2022) for Weight Managment.   LABORATORY TESTING:  - Pap smear: PAP done  IMMUNIZATIONS:   - Tdap: Tetanus vaccination status reviewed: last tetanus booster within 10 years. - Influenza: Up to date - Pneumovax: Not applicable - Prevnar: Not applicable - HPV: Up to date - Zostavax vaccine: Not applicable  SCREENING: -Mammogram: ordered today - Colonoscopy: Not applicable  - Bone Density: Not applicable  -Hearing Test: Not applicable  -Spirometry: Not applicable   PATIENT COUNSELING:   Advised to take 1 mg of folate supplement per day if capable of pregnancy.   Sexuality: Discussed sexually transmitted diseases, partner selection, use of condoms, avoidance of unintended pregnancy  and contraceptive alternatives.   Advised to avoid cigarette smoking.  I discussed with the patient that most people either abstain from alcohol or drink within safe limits (<=14/week and <=4 drinks/occasion for males, <=7/weeks and <= 3 drinks/occasion for females) and that the risk for alcohol disorders  and other health effects rises proportionally with the number of drinks per week and how often a drinker exceeds daily limits.  Discussed cessation/primary prevention of drug use and availability of treatment for abuse.   Diet: Encouraged to adjust caloric intake to maintain  or achieve ideal body weight, to reduce intake of dietary saturated fat and total fat, to limit sodium intake by avoiding high sodium foods and not adding table salt, and to maintain adequate dietary potassium and calcium preferably from fresh fruits, vegetables, and low-fat dairy products.    stressed the importance of regular exercise  Injury prevention: Discussed safety belts, safety helmets, smoke detector, smoking near bedding or upholstery.   Dental health: Discussed importance of regular tooth brushing, flossing, and dental visits.    NEXT PREVENTATIVE PHYSICAL DUE IN 1 YEAR. Return in about 6 weeks (around 05/11/2022) for Weight Managment.

## 2022-03-30 NOTE — Assessment & Plan Note (Signed)
Chronic. BMI 34.2.  Will start Wegovy.  Side effects and benefits of medication discussed during visit. Discussed how to inject medication during visit.  Discussed titrating medication to aid in weight loss.  Recommended eating smaller high protein, low fat meals more frequently and exercising 30 mins a day 5 times a week with a goal of 10-15lb weight loss in the next 3 months. Patient voiced their understanding and motivation to adhere to these recommendations.

## 2022-03-30 NOTE — Patient Instructions (Signed)
Please call to schedule your mammogram and/or bone density: [facility]  Address: [address], [place], [place] [address] Phone: [phone]  [facility] [place] [address] [place],    [address] Phone: [phone]   

## 2022-03-30 NOTE — Assessment & Plan Note (Signed)
Chronic.  Controlled.  Continue with current medication regimen.  Labs ordered today.  Return to clinic in 6 months for reevaluation.  Call sooner if concerns arise.  ? ?

## 2022-03-30 NOTE — Assessment & Plan Note (Signed)
Chronic.  Controlled.  Continue with current medication regimen of Celexa 10mg .  Return to clinic in 5 months for reevaluation.  Call sooner if concerns arise.

## 2022-03-31 LAB — COMPREHENSIVE METABOLIC PANEL
ALT: 18 IU/L (ref 0–32)
AST: 18 IU/L (ref 0–40)
Albumin/Globulin Ratio: 1.4 (ref 1.2–2.2)
Albumin: 4.2 g/dL (ref 3.9–4.9)
Alkaline Phosphatase: 48 IU/L (ref 44–121)
BUN/Creatinine Ratio: 15 (ref 9–23)
BUN: 11 mg/dL (ref 6–24)
Bilirubin Total: <0.2 mg/dL (ref 0.0–1.2)
CO2: 17 mmol/L — ABNORMAL LOW (ref 20–29)
Calcium: 9.4 mg/dL (ref 8.7–10.2)
Chloride: 102 mmol/L (ref 96–106)
Creatinine, Ser: 0.71 mg/dL (ref 0.57–1.00)
Globulin, Total: 3 g/dL (ref 1.5–4.5)
Glucose: 83 mg/dL (ref 70–99)
Potassium: 4.3 mmol/L (ref 3.5–5.2)
Sodium: 135 mmol/L (ref 134–144)
Total Protein: 7.2 g/dL (ref 6.0–8.5)
eGFR: 110 mL/min/{1.73_m2} (ref >59)

## 2022-03-31 LAB — LIPID PANEL
Chol/HDL Ratio: 4.6 ratio — ABNORMAL HIGH (ref 0.0–4.4)
Cholesterol, Total: 259 mg/dL — ABNORMAL HIGH (ref 100–199)
HDL: 56 mg/dL (ref >39)
LDL Chol Calc (NIH): 180 mg/dL — ABNORMAL HIGH (ref 0–99)
Triglycerides: 130 mg/dL (ref 0–149)
VLDL Cholesterol Cal: 23 mg/dL (ref 5–40)

## 2022-03-31 LAB — CBC WITH DIFFERENTIAL/PLATELET
Basophils Absolute: 0.1 10*3/uL (ref 0.0–0.2)
Basos: 1 %
EOS (ABSOLUTE): 0.1 10*3/uL (ref 0.0–0.4)
Eos: 1 %
Hematocrit: 39.5 % (ref 34.0–46.6)
Hemoglobin: 13.3 g/dL (ref 11.1–15.9)
Immature Grans (Abs): 0.1 10*3/uL (ref 0.0–0.1)
Immature Granulocytes: 1 %
Lymphocytes Absolute: 2.4 10*3/uL (ref 0.7–3.1)
Lymphs: 26 %
MCH: 27.8 pg (ref 26.6–33.0)
MCHC: 33.7 g/dL (ref 31.5–35.7)
MCV: 83 fL (ref 79–97)
Monocytes Absolute: 0.4 10*3/uL (ref 0.1–0.9)
Monocytes: 5 %
Neutrophils Absolute: 6.2 10*3/uL (ref 1.4–7.0)
Neutrophils: 66 %
Platelets: 327 10*3/uL (ref 150–450)
RBC: 4.78 x10E6/uL (ref 3.77–5.28)
RDW: 12.4 % (ref 11.7–15.4)
WBC: 9.2 10*3/uL (ref 3.4–10.8)

## 2022-03-31 LAB — TSH: TSH: 2.37 u[IU]/mL (ref 0.450–4.500)

## 2022-04-02 NOTE — Progress Notes (Signed)
Hi Rebekah Cook. It was nice to see you last week.  Your lab work looks good.  Your cholesterol is still elevated but pretty much the same as prior.  No concerns at this time. Continue with your current medication regimen.  Follow up as discussed.  Please let me know if you have any questions.

## 2022-04-03 ENCOUNTER — Telehealth: Payer: Self-pay

## 2022-04-03 NOTE — Telephone Encounter (Signed)
PA for [facility] initiated and submitted via Cover My Meds. Key: [id number]

## 2022-04-04 NOTE — Telephone Encounter (Signed)
PA approved. Mychart message sent to patient notifying her of approval.

## 2022-04-09 LAB — CYTOLOGY - PAP: Diagnosis: NEGATIVE

## 2022-04-09 NOTE — Progress Notes (Signed)
HI Rebekah Cook. Your PAP was normal.  We will repeat it in 5 years.

## 2022-05-07 ENCOUNTER — Encounter: Payer: Self-pay | Admitting: Neurology

## 2022-05-07 ENCOUNTER — Ambulatory Visit: Payer: 59 | Admitting: Neurology

## 2022-05-07 VITALS — BP 134/90 | HR 85 | Ht 64.0 in | Wt 207.0 lb

## 2022-05-07 DIAGNOSIS — G4733 Obstructive sleep apnea (adult) (pediatric): Secondary | ICD-10-CM

## 2022-05-07 NOTE — Progress Notes (Signed)
Subjective:    Patient ID: Rebekah Cook is a 42 y.o. female.  HPI    Interim history:   Rebekah Cook is a 42 year old right-handed woman with an underlying medical history of reflux disease, PCOS, Hx of gestational diabetes, and mild obesity, who presents for follow-up consultation of her obstructive sleep apnea after interim testing and starting AutoPap therapy at home.  The patient is unaccompanied today.  I first met her at the request of her primary care nurse practitioner on 11/23/2021, at which time she reported snoring and excessive daytime somnolence.  She was advised to proceed with a sleep study.  She had a home sleep test on 01/19/2022 which showed severe obstructive sleep apnea - by number of events - with a total AHI of 37.8/hour and O2 nadir of 89%.  Snoring was detected, in the moderate to loud range.  She was advised to proceed with home AutoPap therapy.  Her set up date was 02/12/2022.  She has a ResMed air sense 10 AutoSet machine, her DME provider is [facility].   Today, 05/07/2022: I reviewed her AutoPap compliance data from 04/03/2022 through 05/02/2022 which is a total of 30 days, during which time she used her machine every night with percent use days greater than 4 hours at 100%, indicating superb compliance with an average usage of 8 hours and 13 minutes, residual AHI at goal at 2/h, average pressure for the 95th percentile at 12 cm with a range of 6 to 12 cm with EPR of 3.  Leak acceptable with some fluctuation, 95th percentile at 16.6 L/min.  She reports doing well, having adjusted quite well to treatment. Her husband has commented on her improved daytime energy and wanted her to mention it today. She started off with an under the nose style mask but had to switch to a FFM due to mouth breathing. She still prefers to sleep prone, which is, of course, not easy with a FFM. She typically sleeps in the guest room with her 38 yo son and even he reminds her to put the "mask" on. She  has been mindful of the cleaning of the supplies and has changed the filter. She is quite pleased thus far and very motivated to continue treatment. She is working on weight loss and has a new Rx for [employer] (not started yet).    The patient's allergies, current medications, family history, past medical history, past social history, past surgical history and problem list were reviewed and updated as appropriate.   Previously:   11/23/21: (She) reports snoring and excessive daytime somnolence.  I reviewed your virtual visit note from 09/27/2021.  Her Epworth sleepiness score is 12 out of 24, fatigue severity score is 43 out of 63.  She has had difficulty losing weight after her second child.  She has a 67-year-old daughter and 6-year-old son.  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  She lives with husband and kids, her husband is a stay-at-home dad.  She suspects that her father has sleep apnea.  She has had nocturnal reflux symptoms and has started taking Prilosec in the morning.  She is currently on low-dose Celexa at night.  She has a bedtime of around 9 or 10 PM and rise time around 5 AM.  She starts work shortly after 5 AM.  She works till [employer] PM or 3:30 PM.  She has had increasing in her snoring.  She drinks caffeine in the form of hot tea, typically 1 cup, no  daily soda or coffee.  She has 3 dogs in the household, none of them sleep in the bedroom with them.  She denies night to night nocturia or recurrent morning headaches.  She has had worsening daytime somnolence of the past nearly 1 year.  She is working on weight loss.   Her Past Medical History Is Significant For: Past Medical History:  Diagnosis Date   Complication of anesthesia    HARD TO WAKE UP FROM WISDOM TEETH   Family history of adverse reaction to anesthesia    MOM-   GERD (gastroesophageal reflux disease)    Gestational diabetes    PCOS (polycystic ovarian syndrome)    PONV (postoperative nausea and vomiting)    WITH PREVIOUS  C-SECTION   Vaginal Pap smear, abnormal    2009    Her Past Surgical History Is Significant For: Past Surgical History:  Procedure Laterality Date   CESAREAN SECTION     CESAREAN SECTION N/A 10/13/2018   Procedure: CESAREAN SECTION, REPEAT;  Surgeon: Ward, Honor Loh, MD;  Location: [facility] ORS;  Service: Obstetrics;  Laterality: N/A;   CYST EXCISION     HYSTEROSCOPY Bilateral 2014   MOUTH SURGERY     TEENAGER   NM RENAL LASIX ([place] HX) Bilateral 05/11/2021   WISDOM TOOTH EXTRACTION Bilateral     Her Family History Is Significant For: Family History  Problem Relation Age of Onset   Kidney disease Mother    Cancer Mother        kidney   Miscarriages / [place] Mother    Cancer Father        prostate   Heart disease Father    Parkinson's disease Maternal Aunt    Cancer Paternal Uncle    Cancer Maternal Grandmother    Diabetes Maternal Grandmother    Arthritis Maternal Grandmother    Cancer Maternal Grandfather    Arthritis Maternal Grandfather    Stroke Maternal Grandfather    Diabetes Paternal Grandmother    Stroke Paternal Grandmother    Parkinson's disease Paternal Grandfather    Alzheimer's disease Paternal Grandfather     Her Social History Is Significant For: Social History   Socioeconomic History   Marital status: Married    Spouse name: Corene Cornea   Number of children: Not on file   Years of education: Not on file   Highest education level: Not on file  Occupational History   Occupation: [employer]  Tobacco Use   Smoking status: Never   Smokeless tobacco: Never  Vaping Use   Vaping Use: Never used  Substance and Sexual Activity   Alcohol use: Not Currently    Comment: 1 -2 times yearly   Drug use: Never   Sexual activity: Not Currently    Birth control/protection: Pill  Other Topics Concern   Not on file  Social History Narrative   Caffeine sweet or hot tea two cups daily (10 uz).   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     Married, 2 kids (16 and 51 yr old).   Social Determinants of Health   Financial Resource Strain: Not on file  Food Insecurity: Not on file  Transportation Needs: Not on file  Physical Activity: Not on file  Stress: Not on file  Social Connections: Not on file    Her Allergies Are:  Allergies  Allergen Reactions   Penicillins Swelling    Swelling of gums Did it involve swelling of the face/tongue/throat, SOB,  or low BP? No Did it involve sudden or severe rash/hives, skin peeling, or any reaction on the inside of your mouth or nose? No Did you need to seek medical attention at a hospital or doctor's office? No When did it last happen?      2009 If all above answers are "NO", may proceed with cephalosporin use.    Levofloxacin Nausea And Vomiting  :   Her Current Medications Are:  Outpatient Encounter Medications as of 05/07/2022  Medication Sig   citalopram (CELEXA) 10 MG tablet Take 1 tablet (10 mg total) by mouth daily.   Multiple Vitamin (MULTIVITAMIN) tablet Take 1 tablet by mouth daily.   norethindrone-ethinyl estradiol-iron (JUNEL FE 1.5/30) 1.5-30 MG-MCG tablet Take 1 tablet by mouth daily.   Omeprazole 20 MG TBDD Take 20 mg by mouth daily.   Semaglutide-Weight Management (WEGOVY) 0.25 MG/0.5ML SOAJ Inject 0.25 mg into the skin once a week. (Patient not taking: Reported on 05/07/2022)   No facility-administered encounter medications on file as of 05/07/2022.  :  Review of Systems:  Out of a complete 14 point review of systems, all are reviewed and negative with the exception of these symptoms as listed below:  Review of Systems  Neurological:        Patient is here for initial autopap follow-up. She feels so far things are going ok. It's not very comfortable but overall she is pleased. ESS 9.     Objective:  Neurological Exam  Physical Exam Physical Examination:   Vitals:   05/07/22 1319  BP: (!) 134/90  Pulse: 85    General Examination: The patient is a very  pleasant 42 y.o. female in no acute distress. She appears well-developed and well-nourished and well groomed.   HEENT: Normocephalic, atraumatic, pupils are equal, round and reactive to light, extraocular tracking is good without limitation to gaze excursion or nystagmus noted. Hearing is grossly intact. Face is symmetric with normal facial animation. Speech is clear with no dysarthria noted. There is no hypophonia. There is no lip, neck/head, jaw or voice tremor. Neck is supple with full range of passive and active motion. There are no carotid bruits on auscultation. Oropharynx exam reveals: mild mouth dryness, good dental hygiene and moderate airway crowding.  Tongue protrudes centrally and palate elevates symmetrically.    Chest: Clear to auscultation without wheezing, rhonchi or crackles noted.   Heart: S1+S2+0, regular and normal without murmurs, rubs or gallops noted.    Abdomen: Soft, non-tender and non-distended.   Extremities: There is no obvious edema in the distal lower extremities bilaterally.    Skin: Warm and dry without trophic changes noted.    Musculoskeletal: exam reveals no obvious joint deformities.    Neurologically:  Mental status: The patient is awake, alert and oriented in all 4 spheres. Her immediate and remote memory, attention, language skills and fund of knowledge are appropriate. There is no evidence of aphasia, agnosia, apraxia or anomia. Speech is clear with normal prosody and enunciation. Thought process is linear. Mood is normal and affect is normal.  Cranial nerves II - XII are as described above under HEENT exam.  Motor exam: Normal bulk, strength and tone is noted. There is no obvious tremor. Fine motor skills and coordination: grossly intact.  Cerebellar testing: No dysmetria or intention tremor. There is no truncal or gait ataxia.  Sensory exam: intact to light touch in the upper and lower extremities.  Gait, station and balance: She stands easily. No  veering to one  side is noted. No leaning to one side is noted. Posture is age-appropriate and stance is narrow based. Gait shows normal stride length and normal pace. No problems turning are noted.    Assessment and plan:  In summary, Marvette Jeraldin Betteridge is a very pleasant 42 year old female with an underlying medical history of reflux disease, PCOS, gestational diabetes, and mild obesity, who presents for follow-up consultation of her obstructive sleep apnea after interim testing and starting AutoPap therapy at home. Her home sleep test from 01/19/2022 showed severe obstructive sleep apnea - by number of events - with a total AHI of 37.8/hour and O2 nadir of 89%.  Snoring was in the moderate to loud range.  She was advised to proceed with home AutoPap therapy.  Her set up date was 02/12/2022.  She has a ResMed air sense 10 AutoSet machine, her DME provider is [facility].  She is compliant with treatment and is highly commended for it.  She has benefited from treatment, her Epworth sleepiness score has come down from 12 out of 24 to 9 out of 24.  She is endorsing improvement in her daytime energy.  She is working on weight loss and is supposed to start with Wegovy injections soon.  She is motivated to continue with treatment.  We talked about her sleep test results and reviewed her compliance data in detail today.  At this juncture, she is advised to follow-up routinely in our sleep clinic in 1 year to see one of our nurse practitioners.  I answered all her questions today and she was in agreement with our plan. I spent 30 minutes in total face-to-face time and in reviewing records during pre-charting, more than 50% of which was spent in counseling and coordination of care, reviewing test results, reviewing medications and treatment regimen and/or in discussing or reviewing the diagnosis of OSA, the prognosis and treatment options. Pertinent laboratory and imaging test results that were available during this visit  with the patient were reviewed by me and considered in my medical decision making (see chart for details).

## 2022-05-07 NOTE — Patient Instructions (Signed)
It was nice to see you again today. I am glad to hear, things are going well with your autoPAP therapy. You have adjusted well to treatment with your new machine, and you are compliant with it. You have also fulfilled the insurance-mandated compliance percentage, which is reassuring, so you can get ongoing supplies through your insurance. Please talk to your DME provider about getting replacement supplies on a regular basis. Please be sure to change your filter every month, your mask about every 3 months, hose about every 6 months, humidifier chamber about yearly. Some restrictions are imposed by your insurance carrier with regard to how frequently you can get certain supplies.  Your DME company can provide further details if necessary.   Please continue using your autoPAP regularly. While your insurance requires that you use PAP at least 4 hours each night on 70% of the nights, I recommend, that you not skip any nights and use it throughout the night if you can. Getting used to PAP and staying with the treatment long term does take time and patience and discipline. Untreated obstructive sleep apnea when it is moderate to severe can have an adverse impact on cardiovascular health and raise her risk for heart disease, arrhythmias, hypertension, congestive heart failure, stroke and diabetes. Untreated obstructive sleep apnea causes sleep disruption, nonrestorative sleep, and sleep deprivation. This can have an impact on your day to day functioning and cause daytime sleepiness and impairment of cognitive function, memory loss, mood disturbance, and problems focussing. Using PAP regularly can improve these symptoms.  We can see you in 1 year, you can see one of our nurse practitioners as you are stable.

## 2022-05-11 ENCOUNTER — Ambulatory Visit: Payer: 59 | Admitting: Nurse Practitioner

## 2022-07-17 ENCOUNTER — Ambulatory Visit: Payer: 59 | Admitting: Nurse Practitioner

## 2022-08-07 ENCOUNTER — Ambulatory Visit
Admission: RE | Admit: 2022-08-07 | Discharge: 2022-08-07 | Disposition: A | Discharge status: Home / Self Care | Payer: 59 | Source: Ambulatory Visit | Attending: Nurse Practitioner | Admitting: Nurse Practitioner

## 2022-08-07 DIAGNOSIS — Z1231 Encounter for screening mammogram for malignant neoplasm of breast: Secondary | ICD-10-CM | POA: Insufficient documentation

## 2022-08-08 ENCOUNTER — Other Ambulatory Visit: Payer: Self-pay | Admitting: Nurse Practitioner

## 2022-08-08 DIAGNOSIS — N63 Unspecified lump in unspecified breast: Secondary | ICD-10-CM

## 2022-08-08 DIAGNOSIS — R928 Other abnormal and inconclusive findings on diagnostic imaging of breast: Secondary | ICD-10-CM

## 2022-08-08 NOTE — Progress Notes (Signed)
Hi Rebekah Cook.  Your Mammogram was abnormal.  The radiologist recommended further testing.  The breast center will reach out to you to get this scheduled.

## 2022-08-10 ENCOUNTER — Other Ambulatory Visit: Payer: Self-pay | Admitting: Nurse Practitioner

## 2022-08-10 NOTE — Telephone Encounter (Signed)
Requested medication (s) are due for refill today: yes  Requested medication (s) are on the active medication list: yes  Last refill:  03/30/22  Future visit scheduled: yes  Notes to clinic:  Unable to refill per protocol, Patient not taking:  Reported on 05/07/2022, routing for review.      Requested Prescriptions  Pending Prescriptions Disp Refills   WEGOVY 0.25 MG/0.5ML SOAJ [Pharmacy Med Name: Wegovy 0.25 MG/0.5ML Subcutaneous Solution Auto-injector] 4 mL 0    Sig: INJECT 1 SYRINGE SUBCUTANEOUSLY ONCE A WEEK     Endocrinology:  Diabetes - GLP-1 Receptor Agonists - semaglutide Failed - 08/10/2022  9:13 AM      Failed - HBA1C in normal range and within 180 days    No results found for: "HGBA1C", "LABA1C"       Passed - Cr in normal range and within 360 days    Creatinine, Ser  Date Value Ref Range Status  03/30/2022 0.71 0.57 - 1.00 mg/dL Final         Passed - Valid encounter within last 6 months    Recent Outpatient Visits           4 months ago Annual physical exam   [place] [facility] Larae Grooms, NP   7 months ago Stress   [place] [facility] Larae Grooms, NP   10 months ago Stress   [place] [facility] Larae Grooms, NP   11 months ago Stress   [place] [facility] Larae Grooms, NP   1 year ago Stress   [place] [facility] Larae Grooms, NP       Future Appointments             In 2 weeks Larae Grooms, NP [place] [facility]   In 8 months Huston Foley, MD [facility]

## 2022-08-14 ENCOUNTER — Ambulatory Visit
Admission: RE | Admit: 2022-08-14 | Discharge: 2022-08-14 | Disposition: A | Discharge status: Home / Self Care | Payer: 59 | Source: Ambulatory Visit | Attending: Nurse Practitioner | Admitting: Nurse Practitioner

## 2022-08-14 DIAGNOSIS — N63 Unspecified lump in unspecified breast: Secondary | ICD-10-CM | POA: Diagnosis present

## 2022-08-14 DIAGNOSIS — R928 Other abnormal and inconclusive findings on diagnostic imaging of breast: Secondary | ICD-10-CM

## 2022-08-14 NOTE — Progress Notes (Signed)
Hi Rebekah Cook.  The mammogram shows that there is a lymph node that is present in the left breast and no concerning abnormality present.  It is receommended you repeat the mammogram in 1 year.

## 2022-08-28 ENCOUNTER — Ambulatory Visit: Payer: 59 | Admitting: Nurse Practitioner

## 2022-08-28 VITALS — BP 134/86 | HR 80 | Temp 98.3°F | Wt 201.4 lb

## 2022-08-28 DIAGNOSIS — E669 Obesity, unspecified: Secondary | ICD-10-CM | POA: Diagnosis not present

## 2022-08-28 MED ORDER — CITALOPRAM HYDROBROMIDE 10 MG PO TABS: 10.0000 mg | ORAL_TABLET | Freq: Every day | ORAL | 1 refills | Status: DC

## 2022-08-28 MED ORDER — WEGOVY 0.5 MG/0.5ML ~~LOC~~ SOAJ: 0.5000 mg | SUBCUTANEOUS | 2 refills | Status: DC

## 2022-08-28 NOTE — Assessment & Plan Note (Signed)
Chronic.  Will increase Wegovy dose to 0.5mg  weekly.  Not having side effects from the medication.  She is exercising and changing her eating habits.  Follow up in 4 months.  Call sooner if concerns arise.

## 2022-08-28 NOTE — Progress Notes (Signed)
BP 134/86   Pulse 80   Temp 98.3 F (36.8 C) (Oral)   Wt 201 lb 6.4 oz (91.4 kg)   SpO2 98%   BMI 34.57 kg/m    Subjective:    Patient ID: Rebekah Cook, female    DOB: April 02, 1980, 42 y.o.   MRN: [id number]  HPI: Rebekah Cook is a 42 y.o. female  Chief Complaint  Patient presents with   weight mangement   WEIGHT GAIN Patient states she is exercising 5x weekly.  She noticed that the wegovy helped improve her appetite.  She has taken it for 4 weeks.  Denies side effects from the medication.   Duration: since May  Previous attempts at weight loss: yes Complications of obesity: GERD Peak weight: 202lb Weight loss goal: 150lb Weight loss to date: 6 months Requesting obesity pharmacotherapy: yes Current weight loss supplements/medications: no Previous weight loss supplements/meds: no   Relevant past medical, surgical, family and social history reviewed and updated as indicated. Interim medical history since our last visit reviewed. Allergies and medications reviewed and updated.  Review of Systems  Constitutional:  Negative for unexpected weight change.    Per HPI unless specifically indicated above     Objective:    BP 134/86   Pulse 80   Temp 98.3 F (36.8 C) (Oral)   Wt 201 lb 6.4 oz (91.4 kg)   SpO2 98%   BMI 34.57 kg/m   Wt Readings from Last 3 Encounters:  08/28/22 201 lb 6.4 oz (91.4 kg)  05/07/22 207 lb (93.9 kg)  03/30/22 202 lb 11.2 oz (91.9 kg)    Physical Exam Vitals and nursing note reviewed.  Constitutional:      General: She is not in acute distress.    Appearance: Normal appearance. She is normal weight. She is not ill-appearing, toxic-appearing or diaphoretic.  HENT:     Head: Normocephalic.     Right Ear: External ear normal.     Left Ear: External ear normal.     Nose: Nose normal.     Mouth/Throat:     Mouth: Mucous membranes are moist.     Pharynx: Oropharynx is clear.  Eyes:     General:        Right eye: No  discharge.        Left eye: No discharge.     Extraocular Movements: Extraocular movements intact.     Conjunctiva/sclera: Conjunctivae normal.     Pupils: Pupils are equal, round, and reactive to light.  Cardiovascular:     Rate and Rhythm: Normal rate and regular rhythm.     Heart sounds: No murmur heard. Pulmonary:     Effort: Pulmonary effort is normal. No respiratory distress.     Breath sounds: Normal breath sounds. No wheezing or rales.  Musculoskeletal:     Cervical back: Normal range of motion and neck supple.  Skin:    General: Skin is warm and dry.     Capillary Refill: Capillary refill takes less than 2 seconds.  Neurological:     General: No focal deficit present.     Mental Status: She is alert and oriented to person, place, and time. Mental status is at baseline.  Psychiatric:        Mood and Affect: Mood normal.        Behavior: Behavior normal.        Thought Content: Thought content normal.        Judgment: Judgment normal.  Results for orders placed or performed in visit on 03/30/22  CBC with Differential/Platelet  Result Value Ref Range   WBC 9.2 3.4 - 10.8 x10E3/uL   RBC 4.78 3.77 - 5.28 x10E6/uL   Hemoglobin 13.3 11.1 - 15.9 g/dL   Hematocrit 56.2 13.0 - 46.6 %   MCV 83 79 - 97 fL   MCH 27.8 26.6 - 33.0 pg   MCHC 33.7 31.5 - 35.7 g/dL   RDW 86.5 78.4 - 69.6 %   Platelets 327 150 - 450 x10E3/uL   Neutrophils 66 Not Estab. %   Lymphs 26 Not Estab. %   Monocytes 5 Not Estab. %   Eos 1 Not Estab. %   Basos 1 Not Estab. %   Neutrophils Absolute 6.2 1.4 - 7.0 x10E3/uL   Lymphocytes Absolute 2.4 0.7 - 3.1 x10E3/uL   Monocytes Absolute 0.4 0.1 - 0.9 x10E3/uL   EOS (ABSOLUTE) 0.1 0.0 - 0.4 x10E3/uL   Basophils Absolute 0.1 0.0 - 0.2 x10E3/uL   Immature Granulocytes 1 Not Estab. %   Immature Grans (Abs) 0.1 0.0 - 0.1 x10E3/uL  Comprehensive metabolic panel  Result Value Ref Range   Glucose 83 70 - 99 mg/dL   BUN 11 6 - 24 mg/dL   Creatinine, Ser  2.95 0.57 - 1.00 mg/dL   eGFR 284 >13 KG/MWN/0.27   BUN/Creatinine Ratio 15 9 - 23   Sodium 135 134 - 144 mmol/L   Potassium 4.3 3.5 - 5.2 mmol/L   Chloride 102 96 - 106 mmol/L   CO2 17 (L) 20 - 29 mmol/L   Calcium 9.4 8.7 - 10.2 mg/dL   Total Protein 7.2 6.0 - 8.5 g/dL   Albumin 4.2 3.9 - 4.9 g/dL   Globulin, Total 3.0 1.5 - 4.5 g/dL   Albumin/Globulin Ratio 1.4 1.2 - 2.2   Bilirubin Total <0.2 0.0 - 1.2 mg/dL   Alkaline Phosphatase 48 44 - 121 IU/L   AST 18 0 - 40 IU/L   ALT 18 0 - 32 IU/L  Lipid panel  Result Value Ref Range   Cholesterol, Total 259 (H) 100 - 199 mg/dL   Triglycerides 253 0 - 149 mg/dL   HDL 56 >66 mg/dL   VLDL Cholesterol Cal 23 5 - 40 mg/dL   LDL Chol Calc (NIH) 440 (H) 0 - 99 mg/dL   Chol/HDL Ratio 4.6 (H) 0.0 - 4.4 ratio  TSH  Result Value Ref Range   TSH 2.370 0.450 - 4.500 uIU/mL  Urinalysis, Routine w reflex microscopic  Result Value Ref Range   Specific Gravity, UA 1.020 1.005 - 1.030   pH, UA 7.0 5.0 - 7.5   Color, UA Yellow Yellow   Appearance Ur Cloudy (A) Clear   Leukocytes,UA Negative Negative   Protein,UA Negative Negative/Trace   Glucose, UA Negative Negative   Ketones, UA Negative Negative   RBC, UA Negative Negative   Bilirubin, UA Negative Negative   Urobilinogen, Ur 0.2 0.2 - 1.0 mg/dL   Nitrite, UA Negative Negative   Microscopic Examination Comment   Cytology - PAP  Result Value Ref Range   Adequacy      Satisfactory for evaluation; transformation zone component PRESENT.   Diagnosis      - Negative for intraepithelial lesion or malignancy (NILM)      Assessment & Plan:   Problem List Items Addressed This Visit       Other   Obesity (BMI 30-39.9) - Primary    Chronic.  Will increase [facility]  dose to 0.5mg  weekly.  Not having side effects from the medication.  She is exercising and changing her eating habits.  Follow up in 4 months.  Call sooner if concerns arise.       Relevant Medications   Semaglutide-Weight  Management (WEGOVY) 0.5 MG/0.5ML SOAJ     Follow up plan: Return in about 4 months (around 12/28/2022) for Medication Management.

## 2022-09-10 ENCOUNTER — Encounter: Payer: Self-pay | Admitting: Nurse Practitioner

## 2022-09-19 NOTE — Telephone Encounter (Signed)
Her only active script is for the 0.5 mg dose so I don't know why only the 0.25 mg was approved unless she hasn't been on the medication in some time and has to restart.

## 2022-09-25 ENCOUNTER — Telehealth: Payer: Self-pay

## 2022-09-25 NOTE — Telephone Encounter (Signed)
Entered in error

## 2022-09-27 NOTE — Telephone Encounter (Signed)
Copied from CRM [id number]. Topic: General - Inquiry >> Sep 27, 2022  1:30 PM Teressa P wrote: Reason for CRM: pt would like Destiny to give her a call back.  She has information to share regarding the PA for the [facility].  CB#  [phone]

## 2022-09-28 NOTE — Telephone Encounter (Signed)
Reached out and responded to patient via MyChart.

## 2022-11-07 ENCOUNTER — Telehealth: Payer: Self-pay

## 2022-11-07 NOTE — Telephone Encounter (Signed)
Spoke with patient local [facility] in regards to the CoverMyMeds Key we received for a prior authorization for the patient. Patient has already picked up the dose of 0.5 MG of Wegovy from her local pharmacy back on 10/09/22. Pharmacy technician says the patient may need to increase her dose in order to received the prescription via her insurance. Would patient need an appointment to discuss dose increase and to grab an updated weight? Please advise?

## 2022-11-08 NOTE — Telephone Encounter (Signed)
Called patient left detailed message for patient to return call to schedule appt in mid September for weight check.

## 2022-11-10 ENCOUNTER — Encounter: Payer: Self-pay | Admitting: Nurse Practitioner

## 2022-11-14 ENCOUNTER — Telehealth: Payer: Self-pay

## 2022-11-14 NOTE — Telephone Encounter (Signed)
Spoke with [facility] Prior Authorization department in regards to patient's prescription for Wegovy 0.5 MG. Initiated an authorization to override the max injectors of 8 on patient's insurance plan. Representative was informed patient has been without her medication for almost a month now. Representative informed me patient was approved and back dated the approval from 10/15/22 to 12/14/22. Case # [id number]. Patient was made aware via MyChart. Pharmacy will reach out to the patient when medication is ready for pick.

## 2022-11-14 NOTE — Telephone Encounter (Signed)
If we have it, please start the PA process so she can stay on the 0.5 mg dose.

## 2022-12-08 ENCOUNTER — Other Ambulatory Visit: Payer: Self-pay | Admitting: Nurse Practitioner

## 2022-12-10 NOTE — Telephone Encounter (Signed)
Requested Prescriptions  Pending Prescriptions Disp Refills   WEGOVY 0.5 MG/0.5ML SOAJ [Pharmacy Med Name: WUJWJX 0.5 MG/0.5ML Subcutaneous Solution Auto-injector] 4 mL 0    Sig: INJECT 0.5MG  SUBCUTANEOUSLY  ONCE A WEEK     Endocrinology:  Diabetes - GLP-1 Receptor Agonists - semaglutide Failed - 12/08/2022  6:55 AM      Failed - HBA1C in normal range and within 180 days    No results found for: "HGBA1C", "LABA1C"       Passed - Cr in normal range and within 360 days    Creatinine, Ser  Date Value Ref Range Status  03/30/2022 0.71 0.57 - 1.00 mg/dL Final         Passed - Valid encounter within last 6 months    Recent Outpatient Visits           3 months ago Obesity (BMI 30-39.9)   [place] [facility] Larae Grooms, NP   8 months ago Annual physical exam   [place] [facility] Larae Grooms, NP   11 months ago Stress   [place] [facility] Larae Grooms, NP   1 year ago Stress   [place] [facility] Larae Grooms, NP   1 year ago Stress   [place] [facility] Larae Grooms, NP       Future Appointments             In 3 weeks Larae Grooms, NP [place] [facility]   In 4 months Huston Foley, MD [facility]

## 2023-01-01 ENCOUNTER — Encounter: Payer: Self-pay | Admitting: Nurse Practitioner

## 2023-01-01 ENCOUNTER — Ambulatory Visit: Payer: 59 | Admitting: Nurse Practitioner

## 2023-01-01 VITALS — BP 128/84 | HR 87 | Temp 98.3°F | Wt 196.4 lb

## 2023-01-01 DIAGNOSIS — E669 Obesity, unspecified: Secondary | ICD-10-CM | POA: Diagnosis not present

## 2023-01-01 DIAGNOSIS — F439 Reaction to severe stress, unspecified: Secondary | ICD-10-CM

## 2023-01-01 DIAGNOSIS — E7800 Pure hypercholesterolemia, unspecified: Secondary | ICD-10-CM

## 2023-01-01 DIAGNOSIS — R41840 Attention and concentration deficit: Secondary | ICD-10-CM

## 2023-01-01 MED ORDER — CITALOPRAM HYDROBROMIDE 20 MG PO TABS: 20.0000 mg | ORAL_TABLET | Freq: Every day | ORAL | 1 refills | Status: DC

## 2023-01-01 NOTE — Assessment & Plan Note (Signed)
Chronic.  Ongoing problem.  Will increase Celexa to 20mg .  Follow up in 1 month.  Call sooner if concerns arise.

## 2023-01-01 NOTE — Assessment & Plan Note (Addendum)
Chronic.  Ongoing.  Has not been able to get the medication due to insurance issues.  Will plan to restart medication at the beginning of the year and titrate up monthly.

## 2023-01-01 NOTE — Progress Notes (Signed)
BP 128/84   Pulse 87   Temp 98.3 F (36.8 C) (Oral)   Wt 196 lb 6.4 oz (89.1 kg)   SpO2 99%   BMI 33.71 kg/m    Subjective:    Patient ID: Rebekah Cook, female    DOB: 19-Apr-1980, 42 y.o.   MRN: [id number]  HPI: Rebekah Cook is a 42 y.o. female  Chief Complaint  Patient presents with   Weight Gain   WEIGHT GAIN Patient states her insurance company stopped covering the medication.  She started taking the medication in May and was able to be on it for 2 months.  Then her insurance company stopped covering it.  Duration: since May  Previous attempts at weight loss: yes Complications of obesity: GERD Peak weight: 202lb Weight loss goal: 150lb Weight loss to date: 6 months Requesting obesity pharmacotherapy: yes Current weight loss supplements/medications: no Previous weight loss supplements/meds: no  HYPERLIPIDEMIA Hyperlipidemia status: excellent compliance Satisfied with current treatment?  yes Side effects:  no Medication compliance: excellent compliance Past cholesterol meds: none Supplements: none Aspirin:  no The 10-year ASCVD risk score (Arnett DK, et al., 2019) is: 1%   Values used to calculate the score:     Age: 46 years     Sex: Female     Is Non-Hispanic African American: No     Diabetic: No     Tobacco smoker: No     Systolic Blood Pressure: 128 mmHg     Is BP treated: No     HDL Cholesterol: 56 mg/dL     Total Cholesterol: 259 mg/dL Chest pain:  no Coronary artery disease:  no Family history CAD:  no Family history early CAD:  no  STRESS Feels like her mental health has declined.  She is getting 7-8 hours of sleep, exercising then taking her daughter to school and coming home and taking a nap.  She has been stress eating more and unmotivated to get things done. Is wondering if she does have some ADHD due to difficulty sustaining concentration and completing tasks.     01/01/2023    8:56 AM 08/28/2022    2:15 PM 03/30/2022   10:02  AM  PHQ9 SCORE ONLY  PHQ-9 Total Score 9 1 1         01/01/2023    8:56 AM 08/28/2022    2:16 PM 03/30/2022   10:02 AM 12/28/2021   10:35 AM  GAD 7 : Generalized Anxiety Score  Nervous, Anxious, on Edge 1 0 0 0  Control/stop worrying 0 0 0 0  Worry too much - different things 0 0 0 0  Trouble relaxing 0 0 0 0  Restless 0 0 0 0  Easily annoyed or irritable 1 0 0 0  Afraid - awful might happen 0 0 0 0  Total GAD 7 Score 2 0 0 0  Anxiety Difficulty Not difficult at all Not difficult at all Not difficult at all Not difficult at all      Relevant past medical, surgical, family and social history reviewed and updated as indicated. Interim medical history since our last visit reviewed. Allergies and medications reviewed and updated.  Review of Systems  Constitutional:  Positive for fatigue and unexpected weight change.  Psychiatric/Behavioral:  Positive for decreased concentration and dysphoric mood. Negative for suicidal ideas. The patient is nervous/anxious.     Per HPI unless specifically indicated above     Objective:    BP 128/84   Pulse 87  Temp 98.3 F (36.8 C) (Oral)   Wt 196 lb 6.4 oz (89.1 kg)   SpO2 99%   BMI 33.71 kg/m   Wt Readings from Last 3 Encounters:  01/01/23 196 lb 6.4 oz (89.1 kg)  08/28/22 201 lb 6.4 oz (91.4 kg)  05/07/22 207 lb (93.9 kg)    Physical Exam Vitals and nursing note reviewed.  Constitutional:      General: She is not in acute distress.    Appearance: Normal appearance. She is obese. She is not ill-appearing, toxic-appearing or diaphoretic.  HENT:     Head: Normocephalic.     Right Ear: External ear normal.     Left Ear: External ear normal.     Nose: Nose normal.     Mouth/Throat:     Mouth: Mucous membranes are moist.     Pharynx: Oropharynx is clear.  Eyes:     General:        Right eye: No discharge.        Left eye: No discharge.     Extraocular Movements: Extraocular movements intact.     Conjunctiva/sclera:  Conjunctivae normal.     Pupils: Pupils are equal, round, and reactive to light.  Cardiovascular:     Rate and Rhythm: Normal rate and regular rhythm.     Heart sounds: No murmur heard. Pulmonary:     Effort: Pulmonary effort is normal. No respiratory distress.     Breath sounds: Normal breath sounds. No wheezing or rales.  Musculoskeletal:     Cervical back: Normal range of motion and neck supple.  Skin:    General: Skin is warm and dry.     Capillary Refill: Capillary refill takes less than 2 seconds.  Neurological:     General: No focal deficit present.     Mental Status: She is alert and oriented to person, place, and time. Mental status is at baseline.  Psychiatric:        Mood and Affect: Mood normal.        Behavior: Behavior normal.        Thought Content: Thought content normal.        Judgment: Judgment normal.     Results for orders placed or performed in visit on 03/30/22  CBC with Differential/Platelet  Result Value Ref Range   WBC 9.2 3.4 - 10.8 x10E3/uL   RBC 4.78 3.77 - 5.28 x10E6/uL   Hemoglobin 13.3 11.1 - 15.9 g/dL   Hematocrit 16.1 09.6 - 46.6 %   MCV 83 79 - 97 fL   MCH 27.8 26.6 - 33.0 pg   MCHC 33.7 31.5 - 35.7 g/dL   RDW 04.5 40.9 - 81.1 %   Platelets 327 150 - 450 x10E3/uL   Neutrophils 66 Not Estab. %   Lymphs 26 Not Estab. %   Monocytes 5 Not Estab. %   Eos 1 Not Estab. %   Basos 1 Not Estab. %   Neutrophils Absolute 6.2 1.4 - 7.0 x10E3/uL   Lymphocytes Absolute 2.4 0.7 - 3.1 x10E3/uL   Monocytes Absolute 0.4 0.1 - 0.9 x10E3/uL   EOS (ABSOLUTE) 0.1 0.0 - 0.4 x10E3/uL   Basophils Absolute 0.1 0.0 - 0.2 x10E3/uL   Immature Granulocytes 1 Not Estab. %   Immature Grans (Abs) 0.1 0.0 - 0.1 x10E3/uL  Comprehensive metabolic panel  Result Value Ref Range   Glucose 83 70 - 99 mg/dL   BUN 11 6 - 24 mg/dL   Creatinine, Ser 9.14 0.57 - 1.00  mg/dL   eGFR 213 >08 MV/HQI/6.96   BUN/Creatinine Ratio 15 9 - 23   Sodium 135 134 - 144 mmol/L    Potassium 4.3 3.5 - 5.2 mmol/L   Chloride 102 96 - 106 mmol/L   CO2 17 (L) 20 - 29 mmol/L   Calcium 9.4 8.7 - 10.2 mg/dL   Total Protein 7.2 6.0 - 8.5 g/dL   Albumin 4.2 3.9 - 4.9 g/dL   Globulin, Total 3.0 1.5 - 4.5 g/dL   Albumin/Globulin Ratio 1.4 1.2 - 2.2   Bilirubin Total <0.2 0.0 - 1.2 mg/dL   Alkaline Phosphatase 48 44 - 121 IU/L   AST 18 0 - 40 IU/L   ALT 18 0 - 32 IU/L  Lipid panel  Result Value Ref Range   Cholesterol, Total 259 (H) 100 - 199 mg/dL   Triglycerides 295 0 - 149 mg/dL   HDL 56 >28 mg/dL   VLDL Cholesterol Cal 23 5 - 40 mg/dL   LDL Chol Calc (NIH) 413 (H) 0 - 99 mg/dL   Chol/HDL Ratio 4.6 (H) 0.0 - 4.4 ratio  TSH  Result Value Ref Range   TSH 2.370 0.450 - 4.500 uIU/mL  Urinalysis, Routine w reflex microscopic  Result Value Ref Range   Specific Gravity, UA 1.020 1.005 - 1.030   pH, UA 7.0 5.0 - 7.5   Color, UA Yellow Yellow   Appearance Ur Cloudy (A) Clear   Leukocytes,UA Negative Negative   Protein,UA Negative Negative/Trace   Glucose, UA Negative Negative   Ketones, UA Negative Negative   RBC, UA Negative Negative   Bilirubin, UA Negative Negative   Urobilinogen, Ur 0.2 0.2 - 1.0 mg/dL   Nitrite, UA Negative Negative   Microscopic Examination Comment   Cytology - PAP  Result Value Ref Range   Adequacy      Satisfactory for evaluation; transformation zone component PRESENT.   Diagnosis      - Negative for intraepithelial lesion or malignancy (NILM)      Assessment & Plan:   Problem List Items Addressed This Visit       Other   Obesity (BMI 30-39.9) - Primary    Chronic.  Ongoing.  Has not been able to get the medication due to insurance issues.  Will plan to restart medication at the beginning of the year and titrate up monthly.        Relevant Orders   Comp Met (CMET)   Stress    Chronic.  Ongoing problem.  Will increase Celexa to 20mg .  Follow up in 1 month.  Call sooner if concerns arise.       Hypercholesteremia    Labs  ordered at visit today.  Will make recommendations based on lab results.        Relevant Orders   Lipid Profile   Other Visit Diagnoses     Difficulty concentrating       Referral placed for patient to see [facility]. Scored high on ADHD screening in office today.   Relevant Orders   Ambulatory referral to Psychiatry        Follow up plan: Return in about 1 month (around 02/01/2023) for Medication Management.

## 2023-01-01 NOTE — Assessment & Plan Note (Signed)
Labs ordered at visit today.  Will make recommendations based on lab results.   

## 2023-01-02 LAB — COMPREHENSIVE METABOLIC PANEL
ALT: 16 [IU]/L (ref 0–32)
AST: 15 [IU]/L (ref 0–40)
Albumin: 4.4 g/dL (ref 3.9–4.9)
Alkaline Phosphatase: 68 [IU]/L (ref 44–121)
BUN/Creatinine Ratio: 15 (ref 9–23)
BUN: 10 mg/dL (ref 6–24)
Bilirubin Total: <0.2 mg/dL (ref 0.0–1.2)
CO2: 18 mmol/L — ABNORMAL LOW (ref 20–29)
Calcium: 10.6 mg/dL — ABNORMAL HIGH (ref 8.7–10.2)
Chloride: 101 mmol/L (ref 96–106)
Creatinine, Ser: 0.66 mg/dL (ref 0.57–1.00)
Globulin, Total: 3.1 g/dL (ref 1.5–4.5)
Glucose: 84 mg/dL (ref 70–99)
Potassium: 4.5 mmol/L (ref 3.5–5.2)
Sodium: 138 mmol/L (ref 134–144)
Total Protein: 7.5 g/dL (ref 6.0–8.5)
eGFR: 113 mL/min/{1.73_m2} (ref >59)

## 2023-01-02 LAB — LIPID PANEL
Chol/HDL Ratio: 4.8 ratio — ABNORMAL HIGH (ref 0.0–4.4)
Cholesterol, Total: 279 mg/dL — ABNORMAL HIGH (ref 100–199)
HDL: 58 mg/dL (ref >39)
LDL Chol Calc (NIH): 194 mg/dL — ABNORMAL HIGH (ref 0–99)
Triglycerides: 149 mg/dL (ref 0–149)
VLDL Cholesterol Cal: 27 mg/dL (ref 5–40)

## 2023-01-03 ENCOUNTER — Encounter: Payer: Self-pay | Admitting: Nurse Practitioner

## 2023-01-03 MED ORDER — ROSUVASTATIN CALCIUM 5 MG PO TABS: 5.0000 mg | ORAL_TABLET | Freq: Every day | ORAL | 1 refills | Status: DC

## 2023-02-04 ENCOUNTER — Ambulatory Visit: Payer: 59 | Admitting: Nurse Practitioner

## 2023-02-04 ENCOUNTER — Other Ambulatory Visit: Payer: Self-pay

## 2023-02-04 VITALS — BP 130/84 | HR 82 | Temp 98.1°F | Ht 64.0 in | Wt 202.6 lb

## 2023-02-04 DIAGNOSIS — F439 Reaction to severe stress, unspecified: Secondary | ICD-10-CM

## 2023-02-04 MED ORDER — ROSUVASTATIN CALCIUM 5 MG PO TABS: 5.0000 mg | ORAL_TABLET | Freq: Every day | ORAL | 1 refills | Status: DC

## 2023-02-04 MED ORDER — CITALOPRAM HYDROBROMIDE 20 MG PO TABS: 20.0000 mg | ORAL_TABLET | Freq: Every day | ORAL | 1 refills | Status: DC

## 2023-02-04 NOTE — Telephone Encounter (Signed)
Requesting medications be sent to mail order pharmacy.

## 2023-02-04 NOTE — Progress Notes (Signed)
BP 130/84 (BP Location: Left Arm, Patient Position: Sitting, Cuff Size: Normal)   Pulse 82   Temp 98.1 F (36.7 C) (Oral)   Ht 5\' 4"  (1.626 m)   Wt 202 lb 9.6 oz (91.9 kg)   SpO2 95%   BMI 34.78 kg/m    Subjective:    Patient ID: Rebekah Cook, female    DOB: 07/20/1980, 42 y.o.   MRN: [id number]  HPI: Rebekah Cook is a 42 y.o. female  Chief Complaint  Patient presents with   1 month follow up   Medication Management   STRESS Feels like her mental health has improved.  The dose of Celexa 20mg  is working well for her.  She has not heard from the ADHD referral.  She would like to continue with the dose of Citalopram.  Denies SI.       02/04/2023    2:29 PM 01/01/2023    8:56 AM 08/28/2022    2:15 PM  PHQ9 SCORE ONLY  PHQ-9 Total Score 11 9 1         02/04/2023    2:29 PM 01/01/2023    8:56 AM 08/28/2022    2:16 PM 03/30/2022   10:02 AM  GAD 7 : Generalized Anxiety Score  Nervous, Anxious, on Edge 1 1 0 0  Control/stop worrying 1 0 0 0  Worry too much - different things 1 0 0 0  Trouble relaxing 1 0 0 0  Restless 0 0 0 0  Easily annoyed or irritable 2 1 0 0  Afraid - awful might happen 0 0 0 0  Total GAD 7 Score 6 2 0 0  Anxiety Difficulty  Not difficult at all Not difficult at all Not difficult at all      Relevant past medical, surgical, family and social history reviewed and updated as indicated. Interim medical history since our last visit reviewed. Allergies and medications reviewed and updated.  Review of Systems  Psychiatric/Behavioral:  Positive for decreased concentration and dysphoric mood. Negative for suicidal ideas. The patient is nervous/anxious.     Per HPI unless specifically indicated above     Objective:    BP 130/84 (BP Location: Left Arm, Patient Position: Sitting, Cuff Size: Normal)   Pulse 82   Temp 98.1 F (36.7 C) (Oral)   Ht 5\' 4"  (1.626 m)   Wt 202 lb 9.6 oz (91.9 kg)   SpO2 95%   BMI 34.78 kg/m   Wt  Readings from Last 3 Encounters:  02/04/23 202 lb 9.6 oz (91.9 kg)  01/01/23 196 lb 6.4 oz (89.1 kg)  08/28/22 201 lb 6.4 oz (91.4 kg)    Physical Exam Vitals and nursing note reviewed.  Constitutional:      General: She is not in acute distress.    Appearance: Normal appearance. She is obese. She is not ill-appearing, toxic-appearing or diaphoretic.  HENT:     Head: Normocephalic.     Right Ear: External ear normal.     Left Ear: External ear normal.     Nose: Nose normal.     Mouth/Throat:     Mouth: Mucous membranes are moist.     Pharynx: Oropharynx is clear.  Eyes:     General:        Right eye: No discharge.        Left eye: No discharge.     Extraocular Movements: Extraocular movements intact.     Conjunctiva/sclera: Conjunctivae normal.     Pupils:  Pupils are equal, round, and reactive to light.  Cardiovascular:     Rate and Rhythm: Normal rate and regular rhythm.     Heart sounds: No murmur heard. Pulmonary:     Effort: Pulmonary effort is normal. No respiratory distress.     Breath sounds: Normal breath sounds. No wheezing or rales.  Musculoskeletal:     Cervical back: Normal range of motion and neck supple.  Skin:    General: Skin is warm and dry.     Capillary Refill: Capillary refill takes less than 2 seconds.  Neurological:     General: No focal deficit present.     Mental Status: She is alert and oriented to person, place, and time. Mental status is at baseline.  Psychiatric:        Mood and Affect: Mood normal.        Behavior: Behavior normal.        Thought Content: Thought content normal.        Judgment: Judgment normal.     Results for orders placed or performed in visit on 01/01/23  Comp Met (CMET)  Result Value Ref Range   Glucose 84 70 - 99 mg/dL   BUN 10 6 - 24 mg/dL   Creatinine, Ser 4.03 0.57 - 1.00 mg/dL   eGFR 474 >25 ZD/GLO/7.56   BUN/Creatinine Ratio 15 9 - 23   Sodium 138 134 - 144 mmol/L   Potassium 4.5 3.5 - 5.2 mmol/L    Chloride 101 96 - 106 mmol/L   CO2 18 (L) 20 - 29 mmol/L   Calcium 10.6 (H) 8.7 - 10.2 mg/dL   Total Protein 7.5 6.0 - 8.5 g/dL   Albumin 4.4 3.9 - 4.9 g/dL   Globulin, Total 3.1 1.5 - 4.5 g/dL   Bilirubin Total <4.3 0.0 - 1.2 mg/dL   Alkaline Phosphatase 68 44 - 121 IU/L   AST 15 0 - 40 IU/L   ALT 16 0 - 32 IU/L  Lipid Profile  Result Value Ref Range   Cholesterol, Total 279 (H) 100 - 199 mg/dL   Triglycerides 329 0 - 149 mg/dL   HDL 58 >51 mg/dL   VLDL Cholesterol Cal 27 5 - 40 mg/dL   LDL Chol Calc (NIH) 884 (H) 0 - 99 mg/dL   LDL CALC COMMENT: Comment    Chol/HDL Ratio 4.8 (H) 0.0 - 4.4 ratio      Assessment & Plan:   Problem List Items Addressed This Visit       Other   Stress - Primary    Chronic.  Ongoing.  Continue with Celexa 20mg  daily.  Follow up in 3 months.  Call sooner if concerns arise.          Follow up plan: Return in about 3 months (around 05/07/2023) for Depression/Anxiety FU.

## 2023-02-04 NOTE — Assessment & Plan Note (Signed)
Chronic.  Ongoing.  Continue with Celexa 20mg  daily.  Follow up in 3 months.  Call sooner if concerns arise.

## 2023-03-14 ENCOUNTER — Other Ambulatory Visit: Payer: Self-pay | Admitting: Nurse Practitioner

## 2023-03-14 NOTE — Telephone Encounter (Addendum)
 Medication Refill -  Most Recent Primary Care Visit:  Provider: MELVIN PAO  Department: [facility]  Visit Type: OFFICE VISIT  Date: 02/04/2023  Medication:  norethindrone-ethinyl estradiol -iron (JUNEL FE 1.5/30) 1.5-30 MG-MCG tablet   Has the patient contacted their pharmacy? Yes Pt is out of refills, need medication before she go out of town on 03/31/2023.   Is this the correct pharmacy for this prescription? Yes If no, delete pharmacy and type the correct one.  This is the patient's preferred pharmacy:    [facility] - [place], [place] - [address] [place] [address] Phone: [phone] Fax: [phone]   Has the prescription been filled recently? No  Is the patient out of the medication? Yes  Has the patient been seen for an appointment in the last year OR does the patient have an upcoming appointment? Yes  Can we respond through MyChart? No  Agent: Please be advised that Rx refills may take up to 3 business days. We ask that you follow-up with your pharmacy.

## 2023-03-14 NOTE — Telephone Encounter (Deleted)
 Medication: norethindrone-ethinyl estradiol-iron (JUNEL FE 1.5/30) 1.5-30 MG-MCG tablet

## 2023-03-18 MED ORDER — NORETHIN ACE-ETH ESTRAD-FE 1.5-30 MG-MCG PO TABS: 1.0000 | ORAL_TABLET | Freq: Every day | ORAL | 3 refills | Status: DC

## 2023-03-18 NOTE — Telephone Encounter (Signed)
 Requested Prescriptions  Pending Prescriptions Disp Refills   norethindrone-ethinyl estradiol -iron (JUNEL FE 1.5/30) 1.5-30 MG-MCG tablet 84 tablet 3    Sig: Take 1 tablet by mouth daily.     OB/GYN:  Contraceptives Passed - 03/18/2023 11:24 AM      Passed - Last BP in normal range    BP Readings from Last 1 Encounters:  02/04/23 130/84         Passed - Valid encounter within last 12 months    Recent Outpatient Visits           1 month ago Stress   [place] [facility] [place], Darice, NP   2 months ago Obesity (BMI 30-39.9)   [place] [facility] Melvin Darice, NP   6 months ago Obesity (BMI 30-39.9)   [place] [facility] Melvin Darice, NP   11 months ago Annual physical exam   [place] [facility] Melvin Darice, NP   1 year ago Stress   [place] [facility] Melvin Darice, NP       Future Appointments             In 1 month Buck Saucer, MD [facility]   In 1 month Melvin Darice, NP [place] [facility]            Passed - Patient is not a smoker

## 2023-05-06 ENCOUNTER — Encounter: Payer: Self-pay | Admitting: Neurology

## 2023-05-06 ENCOUNTER — Telehealth (INDEPENDENT_AMBULATORY_CARE_PROVIDER_SITE_OTHER): Payer: 59 | Admitting: Neurology

## 2023-05-06 DIAGNOSIS — G4733 Obstructive sleep apnea (adult) (pediatric): Secondary | ICD-10-CM | POA: Diagnosis not present

## 2023-05-06 NOTE — Progress Notes (Signed)
 Interim history:   Rebekah Cook is a 43 year old right-handed woman with an underlying medical history of reflux disease, PCOS, Hx of gestational diabetes, and mild obesity, who presents for a MyChart video visit for follow-up consultation of her obstructive sleep apnea on autoPAP therapy.  The patient is unaccompanied today and joins via laptop computer from her home office.  I am located in my office at [facility], utilizing my work Health and safety inspector.  I last saw her on 05/07/2022, at which time she was compliant with her AutoPap and doing well.  She benefited from treatment and was motivated to continue with it.  Today, 05/06/2023: I reviewed her AutoPap compliance data from 04/02/2023 through 05/01/2023, which is a total of 30 days, during which time she used her machine every night with percent use days greater than 4 hours at 100%, indicating superb compliance with an average usage of 8 hours and 22 minutes, residual AHI at goal at 2.0/h, 95th percentile of pressure at 11.9 cm with a range of 6 to 12 cm with EPR of 3.  Leak acceptable with the 95th percentile at 12.2 L/min.  She reports doing well with her AutoPap, staying fully compliant and continuing to benefit from it.  In fact, she has gone a night without treatment here and there and noticed it the next day.  She is very motivated to continue with treatment.  She uses a fullface mask with good tolerance and is up-to-date with her supplies.  Her insurance denied her Reginal Lutes last year but she has an appointment to discuss Zepbound with her primary care tomorrow.  She is advised to mention the indication for sleep apnea to her PCP.   The patient's allergies, current medications, family history, past medical history, past social history, past surgical history and problem list were reviewed and updated as appropriate.    Previously:     Previously:  05/07/2022: I reviewed her AutoPap compliance data from 04/03/2022 through 05/02/2022  which is a total of 30 days, during which time she used her machine every night with percent use days greater than 4 hours at 100%, indicating superb compliance with an average usage of 8 hours and 13 minutes, residual AHI at goal at 2/h, average pressure for the 95th percentile at 12 cm with a range of 6 to 12 cm with EPR of 3.  Leak acceptable with some fluctuation, 95th percentile at 16.6 L/min.  She reports doing well, having adjusted quite well to treatment. Her husband has commented on her improved daytime energy and wanted her to mention it today. She started off with an under the nose style mask but had to switch to a FFM due to mouth breathing. She still prefers to sleep prone, which is, of course, not easy with a FFM. She typically sleeps in the guest room with her 21 yo son and even he reminds her to put the "mask" on. She has been mindful of the cleaning of the supplies and has changed the filter. She is quite pleased thus far and very motivated to continue treatment. She is working on weight loss and has a new Rx for [employer] (not started yet).   11/23/21: (She) reports snoring and excessive daytime somnolence.  I reviewed your virtual visit note from 09/27/2021.  Her Epworth sleepiness score is 12 out of 24, fatigue severity score is 43 out of 63.  She has had difficulty losing weight after her second child.  She has a 39-year-old daughter and 86-year-old  son.  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  She lives with husband and kids, her husband is a stay-at-home dad.  She suspects that her father has sleep apnea.  She has had nocturnal reflux symptoms and has started taking Prilosec in the morning.  She is currently on low-dose Celexa at night.  She has a bedtime of around 9 or 10 PM and rise time around 5 AM.  She starts work shortly after 5 AM.  She works till [place] [employer] PM or 3:30 PM.  She has had increasing in her snoring.  She drinks caffeine in the form of hot tea, typically 1 cup, no daily soda or coffee.   She has 3 dogs in the household, none of them sleep in the bedroom with them.  She denies night to night nocturia or recurrent morning headaches.  She has had worsening daytime somnolence of the past nearly 1 year.  She is working on weight loss.   Her Past Medical History Is Significant For: Past Medical History:  Diagnosis Date   Complication of anesthesia    HARD TO WAKE UP FROM WISDOM TEETH   Family history of adverse reaction to anesthesia    MOM-   GERD (gastroesophageal reflux disease)    Gestational diabetes    PCOS (polycystic ovarian syndrome)    PONV (postoperative nausea and vomiting)    WITH PREVIOUS C-SECTION   Vaginal Pap smear, abnormal    2009    Her Past Surgical History Is Significant For: Past Surgical History:  Procedure Laterality Date   CESAREAN SECTION     CESAREAN SECTION N/A 10/13/2018   Procedure: CESAREAN SECTION, REPEAT;  Surgeon: Ward, Elenora Fender, MD;  Location: [facility] ORS;  Service: Obstetrics;  Laterality: N/A;   CYST EXCISION     HYSTEROSCOPY Bilateral 2014   MOUTH SURGERY     TEENAGER   NM RENAL LASIX ([facility] HX) Bilateral 05/11/2021   WISDOM TOOTH EXTRACTION Bilateral     Her Family History Is Significant For: Family History  Problem Relation Age of Onset   Kidney disease Mother    Cancer Mother        kidney   Miscarriages / [place] Mother    Cancer Father        prostate   Heart disease Father    Parkinson's disease Maternal Aunt    Cancer Paternal Uncle    Cancer Maternal Grandmother    Diabetes Maternal Grandmother    Arthritis Maternal Grandmother    Cancer Maternal Grandfather    Arthritis Maternal Grandfather    Stroke Maternal Grandfather    Diabetes Paternal Grandmother    Stroke Paternal Grandmother    Parkinson's disease Paternal Grandfather    Alzheimer's disease Paternal Grandfather     Her Social History Is Significant For: Social History   Socioeconomic History   Marital status: Married    Spouse name: Barbara Cower    Number of children: Not on file   Years of education: Not on file   Highest education level: Bachelor's degree (e.g., BA, AB, BS)  Occupational History   Occupation: [employer]  Tobacco Use   Smoking status: Never   Smokeless tobacco: Never  Vaping Use   Vaping status: Never Used  Substance and Sexual Activity   Alcohol use: Not Currently    Comment: 1 -2 times yearly   Drug use: Never   Sexual activity: Not Currently    Birth control/protection: Pill  Other Topics Concern   Not on  file  Social History Narrative   Caffeine sweet or hot tea two cups daily (10 uz).   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    Married, 2 kids (7 and 42 yr old).   Social Drivers of Corporate investment banker Strain: Low Risk  (02/04/2023)   Overall Financial Resource Strain (CARDIA)    Difficulty of Paying Living Expenses: Not hard at all  Food Insecurity: No Food Insecurity (02/04/2023)   Hunger Vital Sign    Worried About Running Out of Food in the Last Year: Never true    Ran Out of Food in the Last Year: Never true  Transportation Needs: No Transportation Needs (02/04/2023)   PRAPARE - Administrator, Civil Service (Medical): No    Lack of Transportation (Non-Medical): No  Physical Activity: Sufficiently Active (02/04/2023)   Exercise Vital Sign    Days of Exercise per Week: 5 days    Minutes of Exercise per Session: 30 min  Stress: No Stress Concern Present (02/04/2023)   [employer] of Occupational Health - Occupational Stress Questionnaire    Feeling of Stress : Only a little  Social Connections: Moderately Isolated (02/04/2023)   Social Connection and Isolation Panel [NHANES]    Frequency of Communication with Friends and Family: More than three times a week    Frequency of Social Gatherings with Friends and Family: Once a week    Attends Religious Services: Never    Database administrator or Organizations: No    Attends Hospital doctor: Not on file    Marital Status: Married    Her Allergies Are:  Allergies  Allergen Reactions   Penicillins Swelling    Swelling of gums Did it involve swelling of the face/tongue/throat, SOB, or low BP? No Did it involve sudden or severe rash/hives, skin peeling, or any reaction on the inside of your mouth or nose? No Did you need to seek medical attention at a hospital or doctor's office? No When did it last happen?      2009 If all above answers are "NO", may proceed with cephalosporin use.    Levofloxacin Nausea And Vomiting  :   Her Current Medications Are:  Outpatient Encounter Medications as of 05/06/2023  Medication Sig   citalopram (CELEXA) 20 MG tablet Take 1 tablet (20 mg total) by mouth daily.   Multiple Vitamin (MULTIVITAMIN) tablet Take 1 tablet by mouth daily.   norethindrone-ethinyl estradiol-iron (JUNEL FE 1.5/30) 1.5-30 MG-MCG tablet Take 1 tablet by mouth daily.   Omeprazole 20 MG TBDD Take 20 mg by mouth daily.   rosuvastatin (CRESTOR) 5 MG tablet Take 1 tablet (5 mg total) by mouth daily.   WEGOVY 0.5 MG/0.5ML SOAJ INJECT 0.5MG  SUBCUTANEOUSLY  ONCE A WEEK   No facility-administered encounter medications on file as of 05/06/2023.  :  Review of Systems:  Out of a complete 14 point review of systems, all are reviewed and negative with the exception of these symptoms as listed below:  Virtual Visit via Video Note on @ TODAY@  I connected with Rebekah Cook on 05/06/23 at  1:15 PM EST by a video enabled telemedicine application and verified that I am speaking with the correct person using two identifiers.   I discussed the limitations of evaluation and management by telemedicine and the availability of in person appointments. The patient expressed understanding and agreed to proceed.  History of Present Illness:  See  above.  Observations/Objective:  Pleasant, conversant, no acute distress.  Able to speak in full sentences, good  comprehension. Face is symmetric with normal facial animation, speech is clear without dysarthria, hypophonia or voice tremor.  Upper body movements without restriction noted.  Assessment and Plan:  In summary, Rebekah Cook is a very pleasant 43 year old female with an underlying medical history of reflux disease, PCOS, gestational diabetes, and mild obesity, who presents for follow-up consultation of her obstructive sleep apnea, well-established on home AutoPap therapy. Her home sleep test from 01/19/2022 showed severe obstructive sleep apnea with a total AHI of 37.8/hour and O2 nadir of 89%.  Snoring was in the moderate to loud range. Her AutoPap set up date was 02/12/2022.  She has a ResMed air sense 10 AutoSet machine, her DME provider is [facility].  She is compliant with treatment and continues to benefit from treatment.  She is commended for treatment adherence.  She is working on weight loss.  She is advised to talk to her PCP about starting Zepbound injections which has an approval for obstructive sleep apnea comorbid with obesity.  She is motivated to continue with home AutoPap treatment. She is advised to follow-up routinely in our sleep clinic in 1 year to see one of our nurse practitioners.  I answered all her questions today and she was in agreement with our plan.    Follow Up Instructions:    I discussed the assessment and treatment plan with the patient. The patient was provided an opportunity to ask questions and all were answered. The patient agreed with the plan and demonstrated an understanding of the instructions.   The patient was advised to call back or seek an in-person evaluation if the symptoms worsen or if the condition fails to improve as anticipated.  I provided 20 minutes of non-face-to-face time during this encounter.   Huston Foley, MD

## 2023-05-06 NOTE — Patient Instructions (Signed)
 It was nice to see you again.  You are doing well with your AutoPap therapy.  Keep up the good work! You may be eligible for Zepbound injections also on the basis of OSA diagnosis.  Please mention it to your PCP tomorrow. Please continue with AutoPap therapy.  Please follow-up routinely to see one of our nurse practitioners in 1 year, we may do another video visit at the time if you prefer.

## 2023-05-07 ENCOUNTER — Telehealth: Payer: 59 | Admitting: Nurse Practitioner

## 2023-05-07 ENCOUNTER — Encounter: Payer: Self-pay | Admitting: Nurse Practitioner

## 2023-05-07 VITALS — Ht 64.0 in | Wt 210.0 lb

## 2023-05-07 DIAGNOSIS — F909 Attention-deficit hyperactivity disorder, unspecified type: Secondary | ICD-10-CM | POA: Insufficient documentation

## 2023-05-07 DIAGNOSIS — E669 Obesity, unspecified: Secondary | ICD-10-CM

## 2023-05-07 DIAGNOSIS — G4733 Obstructive sleep apnea (adult) (pediatric): Secondary | ICD-10-CM | POA: Diagnosis not present

## 2023-05-07 DIAGNOSIS — F439 Reaction to severe stress, unspecified: Secondary | ICD-10-CM | POA: Diagnosis not present

## 2023-05-07 DIAGNOSIS — F902 Attention-deficit hyperactivity disorder, combined type: Secondary | ICD-10-CM

## 2023-05-07 MED ORDER — ZEPBOUND 2.5 MG/0.5ML ~~LOC~~ SOAJ: 2.5000 mg | SUBCUTANEOUS | 0 refills | Status: DC

## 2023-05-07 MED ORDER — ZEPBOUND 5 MG/0.5ML ~~LOC~~ SOAJ: 5.0000 mg | SUBCUTANEOUS | 2 refills | Status: DC

## 2023-05-07 MED ORDER — CITALOPRAM HYDROBROMIDE 40 MG PO TABS: 40.0000 mg | ORAL_TABLET | Freq: Every day | ORAL | 1 refills | Status: DC

## 2023-05-07 NOTE — Assessment & Plan Note (Signed)
 Followed by Neurology.  Currently on CPAP.

## 2023-05-07 NOTE — Progress Notes (Signed)
 Ht 5\' 4"  (1.626 m)   Wt 210 lb (95.3 kg)   BMI 36.05 kg/m    Subjective:    Patient ID: Rebekah Cook, female    DOB: 09/17/1980, 43 y.o.   MRN: [id number]  HPI: Rebekah Cook is a 43 y.o. female  Chief Complaint  Patient presents with   follow up    Anxiety and depression   STRESS Feels like her mental health has improved.  She does feel like her Celexa could be increased.  She did have an ADHD evaluation and it was confirmed that she does have ADHD.  She feels like the Adderall has improving her symptoms significantly.  She is napping a lot less than prior.   Denies SI.       05/07/2023    8:43 AM 02/04/2023    2:29 PM 01/01/2023    8:56 AM  PHQ9 SCORE ONLY  PHQ-9 Total Score 2 11 9         05/07/2023    8:45 AM 02/04/2023    2:29 PM 01/01/2023    8:56 AM 08/28/2022    2:16 PM  GAD 7 : Generalized Anxiety Score  Nervous, Anxious, on Edge 0 1 1 0  Control/stop worrying 0 1 0 0  Worry too much - different things 0 1 0 0  Trouble relaxing 0 1 0 0  Restless 0 0 0 0  Easily annoyed or irritable 0 2 1 0  Afraid - awful might happen 0 0 0 0  Total GAD 7 Score 0 6 2 0  Anxiety Difficulty Not difficult at all  Not difficult at all Not difficult at all    She met with Neurology yesterday about sleep apnea.  They recommended Zepbound for the OSA indication.  She would like to change to Zepbound.    Relevant past medical, surgical, family and social history reviewed and updated as indicated. Interim medical history since our last visit reviewed. Allergies and medications reviewed and updated.  Review of Systems  Constitutional:  Positive for unexpected weight change.  Psychiatric/Behavioral:  Positive for decreased concentration, dysphoric mood and sleep disturbance. Negative for suicidal ideas. The patient is nervous/anxious.     Per HPI unless specifically indicated above     Objective:    Ht 5\' 4"  (1.626 m)   Wt 210 lb (95.3 kg)   BMI 36.05 kg/m    Wt Readings from Last 3 Encounters:  05/07/23 210 lb (95.3 kg)  02/04/23 202 lb 9.6 oz (91.9 kg)  01/01/23 196 lb 6.4 oz (89.1 kg)    Physical Exam Vitals and nursing note reviewed.  Constitutional:      General: She is not in acute distress.    Appearance: She is not ill-appearing.  HENT:     Head: Normocephalic.     Right Ear: Hearing normal.     Left Ear: Hearing normal.     Nose: Nose normal.  Pulmonary:     Effort: Pulmonary effort is normal. No respiratory distress.  Neurological:     Mental Status: She is alert.  Psychiatric:        Mood and Affect: Mood normal.        Behavior: Behavior normal.        Thought Content: Thought content normal.        Judgment: Judgment normal.     Results for orders placed or performed in visit on 01/01/23  Comp Met (CMET)   Collection Time: 01/01/23  9:34  AM  Result Value Ref Range   Glucose 84 70 - 99 mg/dL   BUN 10 6 - 24 mg/dL   Creatinine, Ser 1.61 0.57 - 1.00 mg/dL   eGFR 096 >04 VW/UJW/1.19   BUN/Creatinine Ratio 15 9 - 23   Sodium 138 134 - 144 mmol/L   Potassium 4.5 3.5 - 5.2 mmol/L   Chloride 101 96 - 106 mmol/L   CO2 18 (L) 20 - 29 mmol/L   Calcium 10.6 (H) 8.7 - 10.2 mg/dL   Total Protein 7.5 6.0 - 8.5 g/dL   Albumin 4.4 3.9 - 4.9 g/dL   Globulin, Total 3.1 1.5 - 4.5 g/dL   Bilirubin Total <1.4 0.0 - 1.2 mg/dL   Alkaline Phosphatase 68 44 - 121 IU/L   AST 15 0 - 40 IU/L   ALT 16 0 - 32 IU/L  Lipid Profile   Collection Time: 01/01/23  9:34 AM  Result Value Ref Range   Cholesterol, Total 279 (H) 100 - 199 mg/dL   Triglycerides 782 0 - 149 mg/dL   HDL 58 >95 mg/dL   VLDL Cholesterol Cal 27 5 - 40 mg/dL   LDL Chol Calc (NIH) 621 (H) 0 - 99 mg/dL   LDL CALC COMMENT: Comment    Chol/HDL Ratio 4.8 (H) 0.0 - 4.4 ratio      Assessment & Plan:   Problem List Items Addressed This Visit       Respiratory   OSA (obstructive sleep apnea)   Followed by Neurology.  Currently on CPAP.        Other   Obesity  (BMI 30-39.9)   Chronic.  Recommended eating smaller high protein, low fat meals more frequently and exercising 30 mins a day 5 times a week with a goal of 10-15lb weight loss in the next 3 months.  Patient has been on Wegovy with only a 10lb reduction in weight in 6 months.  Patient has tried diet and exercise for weight loss without success.  Will start Zepbound 2.5mg  weekly.  Will increase to Zepbound 5mg  weekly after the first 4 weeks.  Discussed how to inject medication.  Discussed side effects and benefits of medication.  Follow up in 3 months.  Call sooner if concerns arise.           Relevant Medications   ADDERALL 5 MG tablet   amphetamine-dextroamphetamine (ADDERALL) 20 MG tablet   tirzepatide (ZEPBOUND) 5 MG/0.5ML Pen   tirzepatide (ZEPBOUND) 2.5 MG/0.5ML Pen   Stress - Primary   Chronic.  Increased dose of Celexa to 40mg .   Follow up in 3 months.  Call sooner if concerns arise.       ADHD   New problem.  Had formal ADHD evaluation.  Currently on Adderall 20mg  in the am and 10mg  in the pm.  Working with psychiatrist to get dosing figured out.  Continue to follow up with specialist.         Follow up plan: Return in about 3 months (around 08/04/2023) for Weight Managment, Depression/Anxiety FU (Virtual).   This visit was completed via MyChart due to the restrictions of the COVID-19 pandemic. All issues as above were discussed and addressed. Physical exam was done as above through visual confirmation on MyChart. If it was felt that the patient should be evaluated in the office, they were directed there. The patient verbally consented to this visit. Location of the patient: Home Location of the provider: Office Those involved with this call:  Provider: Larae Grooms,  NP CMA: Irene Pap, CMA Front Desk/Registration: Servando Snare This encounter was conducted via video.  I spent 30 mins dedicated to the care of this patient on the date of this encounter to include previsit  review of symptoms, plan of care and follow up, face to face time with the patient, and post visit ordering of testing.

## 2023-05-07 NOTE — Assessment & Plan Note (Signed)
 Chronic.  Recommended eating smaller high protein, low fat meals more frequently and exercising 30 mins a day 5 times a week with a goal of 10-15lb weight loss in the next 3 months.  Patient has been on Wegovy with only a 10lb reduction in weight in 6 months.  Patient has tried diet and exercise for weight loss without success.  Will start Zepbound 2.5mg  weekly.  Will increase to Zepbound 5mg  weekly after the first 4 weeks.  Discussed how to inject medication.  Discussed side effects and benefits of medication.  Follow up in 3 months.  Call sooner if concerns arise.

## 2023-05-07 NOTE — Assessment & Plan Note (Signed)
 New problem.  Had formal ADHD evaluation.  Currently on Adderall 20mg  in the am and 10mg  in the pm.  Working with psychiatrist to get dosing figured out.  Continue to follow up with specialist.

## 2023-05-07 NOTE — Assessment & Plan Note (Signed)
 Chronic.  Increased dose of Celexa to 40mg .   Follow up in 3 months.  Call sooner if concerns arise.

## 2023-05-14 ENCOUNTER — Telehealth: Payer: Self-pay

## 2023-05-14 NOTE — Telephone Encounter (Signed)
 PA for Zepbound initiated and submitted via Cover My Meds. Key: Rebekah Cook

## 2023-05-23 NOTE — Telephone Encounter (Signed)
 PA approved.

## 2023-06-03 ENCOUNTER — Encounter: Payer: Self-pay | Admitting: Nurse Practitioner

## 2023-06-11 ENCOUNTER — Other Ambulatory Visit: Payer: Self-pay | Admitting: Nurse Practitioner

## 2023-06-13 NOTE — Telephone Encounter (Signed)
 Requested Prescriptions  Pending Prescriptions Disp Refills   rosuvastatin (CRESTOR) 5 MG tablet [Pharmacy Med Name: ROSUVASTATIN TABS 5MG ] 90 tablet 1    Sig: TAKE 1 TABLET DAILY     Cardiovascular:  Antilipid - Statins 2 Failed - 06/13/2023  8:30 AM      Failed - Lipid Panel in normal range within the last 12 months    Cholesterol, Total  Date Value Ref Range Status  01/01/2023 279 (H) 100 - 199 mg/dL Final   LDL Chol Calc (NIH)  Date Value Ref Range Status  01/01/2023 194 (H) 0 - 99 mg/dL Final   HDL  Date Value Ref Range Status  01/01/2023 58 >39 mg/dL Final   Triglycerides  Date Value Ref Range Status  01/01/2023 149 0 - 149 mg/dL Final         Passed - Cr in normal range and within 360 days    Creatinine, Ser  Date Value Ref Range Status  01/01/2023 0.66 0.57 - 1.00 mg/dL Final         Passed - Patient is not pregnant      Passed - Valid encounter within last 12 months    Recent Outpatient Visits           1 month ago Stress   [facility] Larae Grooms, NP

## 2023-07-16 ENCOUNTER — Other Ambulatory Visit: Payer: Self-pay | Admitting: Nurse Practitioner

## 2023-07-17 NOTE — Telephone Encounter (Signed)
 Too soon for refill, last refill 05/07/23 for 90 and 1 refill.  Requested Prescriptions  Pending Prescriptions Disp Refills   citalopram  (CELEXA ) 20 MG tablet [Pharmacy Med Name: CITALOPRAM  HYDROBROMIDE TABS 20MG ] 90 tablet 3    Sig: TAKE 1 TABLET DAILY     Psychiatry:  Antidepressants - SSRI Passed - 07/17/2023  2:19 PM      Passed - Valid encounter within last 6 months    Recent Outpatient Visits           2 months ago Stress   [facility] Aileen Alexanders, NP

## 2023-08-06 ENCOUNTER — Telehealth: Payer: 59 | Admitting: Nurse Practitioner

## 2023-08-06 DIAGNOSIS — F439 Reaction to severe stress, unspecified: Secondary | ICD-10-CM | POA: Diagnosis not present

## 2023-08-06 DIAGNOSIS — E669 Obesity, unspecified: Secondary | ICD-10-CM | POA: Diagnosis not present

## 2023-08-06 MED ORDER — ZEPBOUND 5 MG/0.5ML ~~LOC~~ SOAJ: 5.0000 mg | SUBCUTANEOUS | 2 refills | Status: DC

## 2023-08-06 MED ORDER — CITALOPRAM HYDROBROMIDE 40 MG PO TABS: 40.0000 mg | ORAL_TABLET | Freq: Every day | ORAL | 1 refills | Status: DC

## 2023-08-06 NOTE — Assessment & Plan Note (Signed)
 Doing well with Zepbound .  Has lost 33lbs pounds.  Will continue with Zepbound  5mg .  Feels like she is doing well.

## 2023-08-06 NOTE — Progress Notes (Signed)
 Appt scheduled

## 2023-08-06 NOTE — Progress Notes (Signed)
 LMP  (LMP Unknown)    Subjective:    Patient ID: Rebekah Cook, female    DOB: Aug 05, 1980, 43 y.o.   MRN: [id number]  HPI: Rebekah Cook is a 43 y.o. female  Chief Complaint  Patient presents with   Weight Management Screening    Feeling well and no side effects   Depression/Anxiety    Doing pretty well. Feeling worse the last week but likely due to being sick and new puppy.    STRESS Feels like her mental health has been good except for the last week.  She recently got a new puppy and that has been more stressful.  She does feel like her Celexa  is a good dose.  She did have an ADHD evaluation and it was confirmed that she does have ADHD.  She feels like the Adderall has improving her symptoms significantly.  She is napping a lot less than prior.   Denies SI.       08/06/2023   10:10 AM 05/07/2023    8:43 AM 02/04/2023    2:29 PM  PHQ9 SCORE ONLY  PHQ-9 Total Score 0 2 11        08/06/2023   10:11 AM 05/07/2023    8:45 AM 02/04/2023    2:29 PM 01/01/2023    8:56 AM  GAD 7 : Generalized Anxiety Score  Nervous, Anxious, on Edge 0 0 1 1  Control/stop worrying 0 0 1 0  Worry too much - different things 0 0 1 0  Trouble relaxing 0 0 1 0  Restless 0 0 0 0  Easily annoyed or irritable 1 0 2 1  Afraid - awful might happen 0 0 0 0  Total GAD 7 Score 1 0 6 2  Anxiety Difficulty  Not difficult at all  Not difficult at all   WEIGHT MANAGEMENT  Has lost 33lbs since starting Zepbound . Feels like she is doing great.  She is down from an XL shirt to a M shirt.   Relevant past medical, surgical, family and social history reviewed and updated as indicated. Interim medical history since our last visit reviewed. Allergies and medications reviewed and updated.  Review of Systems  Constitutional:  Negative for unexpected weight change.  Psychiatric/Behavioral:  Positive for decreased concentration, dysphoric mood and sleep disturbance. Negative for suicidal ideas. The  patient is nervous/anxious.     Per HPI unless specifically indicated above     Objective:    LMP  (LMP Unknown)   Wt Readings from Last 3 Encounters:  05/07/23 210 lb (95.3 kg)  02/04/23 202 lb 9.6 oz (91.9 kg)  01/01/23 196 lb 6.4 oz (89.1 kg)    Physical Exam Vitals and nursing note reviewed.  Constitutional:      General: She is not in acute distress.    Appearance: She is not ill-appearing.  HENT:     Head: Normocephalic.     Right Ear: Hearing normal.     Left Ear: Hearing normal.     Nose: Nose normal.  Pulmonary:     Effort: Pulmonary effort is normal. No respiratory distress.  Neurological:     Mental Status: She is alert.  Psychiatric:        Mood and Affect: Mood normal.        Behavior: Behavior normal.        Thought Content: Thought content normal.        Judgment: Judgment normal.     Results for orders  placed or performed in visit on 01/01/23  Comp Met (CMET)   Collection Time: 01/01/23  9:34 AM  Result Value Ref Range   Glucose 84 70 - 99 mg/dL   BUN 10 6 - 24 mg/dL   Creatinine, Ser 2.95 0.57 - 1.00 mg/dL   eGFR 621 >30 QM/VHQ/4.69   BUN/Creatinine Ratio 15 9 - 23   Sodium 138 134 - 144 mmol/L   Potassium 4.5 3.5 - 5.2 mmol/L   Chloride 101 96 - 106 mmol/L   CO2 18 (L) 20 - 29 mmol/L   Calcium  10.6 (H) 8.7 - 10.2 mg/dL   Total Protein 7.5 6.0 - 8.5 g/dL   Albumin 4.4 3.9 - 4.9 g/dL   Globulin, Total 3.1 1.5 - 4.5 g/dL   Bilirubin Total <6.2 0.0 - 1.2 mg/dL   Alkaline Phosphatase 68 44 - 121 IU/L   AST 15 0 - 40 IU/L   ALT 16 0 - 32 IU/L  Lipid Profile   Collection Time: 01/01/23  9:34 AM  Result Value Ref Range   Cholesterol, Total 279 (H) 100 - 199 mg/dL   Triglycerides 952 0 - 149 mg/dL   HDL 58 >84 mg/dL   VLDL Cholesterol Cal 27 5 - 40 mg/dL   LDL Chol Calc (NIH) 132 (H) 0 - 99 mg/dL   LDL CALC COMMENT: Comment    Chol/HDL Ratio 4.8 (H) 0.0 - 4.4 ratio      Assessment & Plan:   Problem List Items Addressed This Visit        Other   Obesity (BMI 30-39.9) - Primary   Doing well with Zepbound .  Has lost 33lbs pounds.  Will continue with Zepbound  5mg .  Feels like she is doing well.        Relevant Medications   tirzepatide  (ZEPBOUND ) 5 MG/0.5ML Pen   Stress   Chronic.  Controlled.  Continue with current medication regimen of Celexa  40mg  daily.  Labs ordered today.  Return to clinic in 6 months for reevaluation.  Call sooner if concerns arise.            Follow up plan: Return in about 3 months (around 11/06/2023) for Weight Managment.   This visit was completed via MyChart due to the restrictions of the COVID-19 pandemic. All issues as above were discussed and addressed. Physical exam was done as above through visual confirmation on MyChart. If it was felt that the patient should be evaluated in the office, they were directed there. The patient verbally consented to this visit. Location of the patient: Home Location of the provider: Office Those involved with this call:  Provider: Aileen Alexanders, NP CMA: Althia Jetty, CMA Front Desk/Registration: Jaynee Meyer This encounter was conducted via video.  I spent 30 mins dedicated to the care of this patient on the date of this encounter to include previsit review of symptoms, plan of care and follow up, face to face time with the patient, and post visit ordering of testing.

## 2023-08-06 NOTE — Assessment & Plan Note (Signed)
 Chronic.  Controlled.  Continue with current medication regimen of Celexa  40mg  daily.  Labs ordered today.  Return to clinic in 6 months for reevaluation.  Call sooner if concerns arise.

## 2023-08-12 ENCOUNTER — Ambulatory Visit (INDEPENDENT_AMBULATORY_CARE_PROVIDER_SITE_OTHER)

## 2023-08-12 ENCOUNTER — Ambulatory Visit: Admitting: Podiatry

## 2023-08-12 DIAGNOSIS — M79674 Pain in right toe(s): Secondary | ICD-10-CM | POA: Diagnosis not present

## 2023-08-12 DIAGNOSIS — L600 Ingrowing nail: Secondary | ICD-10-CM | POA: Diagnosis not present

## 2023-08-12 NOTE — Progress Notes (Unsigned)
 Chief Complaint  Patient presents with   Nail Problem    Right hallux nail discomfort in the top corner    HPI: 43 y.o. female presents today with concern of pain along the side of the right great toe.  She notes that she is very active/busy with her child school activities.  This should continue for the whole summer.  Feels that the toenail might be ingrown.  Not sure if she is able to have anything performed today due to upcoming events and needing to be on her feet.  Past Medical History:  Diagnosis Date   Complication of anesthesia    HARD TO WAKE UP FROM WISDOM TEETH   Family history of adverse reaction to anesthesia    MOM-   GERD (gastroesophageal reflux disease)    Gestational diabetes    PCOS (polycystic ovarian syndrome)    PONV (postoperative nausea and vomiting)    WITH PREVIOUS C-SECTION   Vaginal Pap smear, abnormal    2009   Past Surgical History:  Procedure Laterality Date   CESAREAN SECTION     CESAREAN SECTION N/A 10/13/2018   Procedure: CESAREAN SECTION, REPEAT;  Surgeon: Ward, Margarie Shay, MD;  Location: [facility] ORS;  Service: Obstetrics;  Laterality: N/A;   CYST EXCISION     HYSTEROSCOPY Bilateral 2014   MOUTH SURGERY     TEENAGER   NM RENAL LASIX  ([facility] HX) Bilateral 05/11/2021   WISDOM TOOTH EXTRACTION Bilateral    Allergies  Allergen Reactions   Penicillins Swelling    Swelling of gums Did it involve swelling of the face/tongue/throat, SOB, or low BP? No Did it involve sudden or severe rash/hives, skin peeling, or any reaction on the inside of your mouth or nose? No Did you need to seek medical attention at a hospital or doctor's office? No When did it last happen?      2009 If all above answers are "NO", may proceed with cephalosporin use.    Levofloxacin Nausea And Vomiting    Physical Exam: Palpable pedal pulses noted.  No open lesions seen.  Minimal hypertrophy of medial nail margin of the right hallux.  There is pain on palpation along the entire  border.  There is incurvation of the toenail without paronychia.  No active drainage is noted.  Epicritic sensation is intact.  Radiographic Exam (right foot, 3 weightbearing views, 08/12/2023):  Normal osseous mineralization. No fractures noted.  No foreign body seen in the area of discomfort in the hallux.  Assessment/Plan of Care: 1. Ingrown toenail   2. Pain of toe of right foot     DG FOOT COMPLETE RIGHT  Reviewed clinical and radiographic findings with patient today.  I did recommend a PNA nail procedure of the right hallux medial nail border.  And she is not able to remain compliant with the postop instructions due to many activities as scheduled with her kids this summer.  We discussed the procedure in detail and she will call back in the fall if she is ready to proceed with the procedure.  The distal medial nail margin was cut back uneventfully and she noted immediate improvement.  Follow-up as needed   Joe Murders, DPM, FACFAS [facility]     [address]  [place], [place] [address]                Office [phone]  Fax [phone]

## 2023-08-13 ENCOUNTER — Ambulatory Visit: Admitting: Podiatry

## 2023-09-30 ENCOUNTER — Telehealth: Payer: Self-pay

## 2023-09-30 ENCOUNTER — Other Ambulatory Visit (HOSPITAL_COMMUNITY): Payer: Self-pay

## 2023-09-30 NOTE — Telephone Encounter (Signed)
 Pharmacy Patient Advocate Encounter   Received notification from Onbase that prior authorization for Zepbound  5MG /0.5ML pen-injectors  is required/requested.   Insurance verification completed.   The patient is insured through [employer] .   Per test claim: PA required; PA started via CoverMyMeds. KEY [id number] . Waiting for clinical questions to populate.

## 2023-10-01 ENCOUNTER — Other Ambulatory Visit (HOSPITAL_COMMUNITY): Payer: Self-pay

## 2023-10-04 NOTE — Telephone Encounter (Signed)
 Got a message back on CMM saying additional refills are not covered, 90 day supply required. Upon talking with the pharmacy and the patient, her insurance is making her do a 90 day supply through her insurance's mail order supply. No PA is needed at this time.

## 2023-11-05 ENCOUNTER — Other Ambulatory Visit: Payer: Self-pay | Admitting: Nurse Practitioner

## 2023-11-05 DIAGNOSIS — Z1231 Encounter for screening mammogram for malignant neoplasm of breast: Secondary | ICD-10-CM

## 2023-11-06 ENCOUNTER — Encounter: Payer: Self-pay | Admitting: Nurse Practitioner

## 2023-11-06 ENCOUNTER — Ambulatory Visit: Admitting: Nurse Practitioner

## 2023-11-06 VITALS — BP 123/87 | HR 98 | Temp 98.3°F | Wt 155.4 lb

## 2023-11-06 DIAGNOSIS — E669 Obesity, unspecified: Secondary | ICD-10-CM

## 2023-11-06 DIAGNOSIS — F439 Reaction to severe stress, unspecified: Secondary | ICD-10-CM | POA: Diagnosis not present

## 2023-11-06 MED ORDER — LEVONORGEST-ETH ESTRAD 91-DAY 0.15-0.03 MG PO TABS: 1.0000 | ORAL_TABLET | Freq: Every day | ORAL | 4 refills | Status: DC

## 2023-11-06 MED ORDER — TIRZEPATIDE-WEIGHT MANAGEMENT 7.5 MG/0.5ML ~~LOC~~ SOLN: 7.5000 mg | SUBCUTANEOUS | 3 refills | Status: DC

## 2023-11-06 NOTE — Assessment & Plan Note (Signed)
 Chronic.  Controlled.  Continue with current medication regimen of Celexa  40mg .  Refills sent today.  Return to clinic in 6 months for reevaluation.  Call sooner if concerns arise.

## 2023-11-06 NOTE — Progress Notes (Signed)
 BP 123/87   Pulse 98   Temp 98.3 F (36.8 C) (Oral)   Wt 155 lb 6.4 oz (70.5 kg)   SpO2 98%   BMI 26.67 kg/m    Subjective:    Patient ID: Rebekah Cook, female    DOB: 1980/05/13, 43 y.o.   MRN: [id number]  HPI: Rebekah Cook is a 43 y.o. female  Chief Complaint  Patient presents with   Weight Check   STRESS Feels like her mental health has been good except for the last week.  She feels like the celexa  is working well for her.  She is having more stress with her job.  She is still taking Adderall which is working well for her.  Denies concerns at visit today.  Denies SI.     11/06/2023    1:33 PM 08/06/2023   10:10 AM 05/07/2023    8:43 AM  PHQ9 SCORE ONLY  PHQ-9 Total Score 0 0  2      Data saved with a previous flowsheet row definition        11/06/2023    1:34 PM 08/06/2023   10:11 AM 05/07/2023    8:45 AM 02/04/2023    2:29 PM  GAD 7 : Generalized Anxiety Score  Nervous, Anxious, on Edge 0 0 0 1  Control/stop worrying 0 0 0 1  Worry too much - different things 0 0 0 1  Trouble relaxing 0 0 0 1  Restless 0 0 0 0  Easily annoyed or irritable 0 1 0 2  Afraid - awful might happen 0 0 0 0  Total GAD 7 Score 0 1 0 6  Anxiety Difficulty Not difficult at all  Not difficult at all    WEIGHT MANAGEMENT  Has lost 57lbs since starting Zepbound .   Feels like she is doing great.  She is down from an XL shirt to a M shirt.  She is getting it through her insurance.  She is happy with her current weight but would love to get to the 140s.   Relevant past medical, surgical, family and social history reviewed and updated as indicated. Interim medical history since our last visit reviewed. Allergies and medications reviewed and updated.  Review of Systems  Constitutional:  Negative for unexpected weight change.  Psychiatric/Behavioral:  Positive for decreased concentration, dysphoric mood and sleep disturbance. Negative for suicidal ideas. The patient is  nervous/anxious.     Per HPI unless specifically indicated above     Objective:    BP 123/87   Pulse 98   Temp 98.3 F (36.8 C) (Oral)   Wt 155 lb 6.4 oz (70.5 kg)   SpO2 98%   BMI 26.67 kg/m   Wt Readings from Last 3 Encounters:  11/06/23 155 lb 6.4 oz (70.5 kg)  05/07/23 210 lb (95.3 kg)  02/04/23 202 lb 9.6 oz (91.9 kg)    Physical Exam Vitals and nursing note reviewed.  Constitutional:      General: She is not in acute distress.    Appearance: Normal appearance. She is normal weight. She is not ill-appearing, toxic-appearing or diaphoretic.  HENT:     Head: Normocephalic.     Right Ear: External ear normal.     Left Ear: External ear normal.     Nose: Nose normal.     Mouth/Throat:     Mouth: Mucous membranes are moist.     Pharynx: Oropharynx is clear.  Eyes:     General:  Right eye: No discharge.        Left eye: No discharge.     Extraocular Movements: Extraocular movements intact.     Conjunctiva/sclera: Conjunctivae normal.     Pupils: Pupils are equal, round, and reactive to light.  Cardiovascular:     Rate and Rhythm: Normal rate and regular rhythm.     Heart sounds: No murmur heard. Pulmonary:     Effort: Pulmonary effort is normal. No respiratory distress.     Breath sounds: Normal breath sounds. No wheezing or rales.  Musculoskeletal:     Cervical back: Normal range of motion and neck supple.  Skin:    General: Skin is warm and dry.     Capillary Refill: Capillary refill takes less than 2 seconds.  Neurological:     General: No focal deficit present.     Mental Status: She is alert and oriented to person, place, and time. Mental status is at baseline.  Psychiatric:        Mood and Affect: Mood normal.        Behavior: Behavior normal.        Thought Content: Thought content normal.        Judgment: Judgment normal.     Results for orders placed or performed in visit on 01/01/23  Comp Met (CMET)   Collection Time: 01/01/23  9:34 AM   Result Value Ref Range   Glucose 84 70 - 99 mg/dL   BUN 10 6 - 24 mg/dL   Creatinine, Ser 9.33 0.57 - 1.00 mg/dL   eGFR 886 >40 fO/fpw/8.26   BUN/Creatinine Ratio 15 9 - 23   Sodium 138 134 - 144 mmol/L   Potassium 4.5 3.5 - 5.2 mmol/L   Chloride 101 96 - 106 mmol/L   CO2 18 (L) 20 - 29 mmol/L   Calcium  10.6 (H) 8.7 - 10.2 mg/dL   Total Protein 7.5 6.0 - 8.5 g/dL   Albumin 4.4 3.9 - 4.9 g/dL   Globulin, Total 3.1 1.5 - 4.5 g/dL   Bilirubin Total <9.7 0.0 - 1.2 mg/dL   Alkaline Phosphatase 68 44 - 121 IU/L   AST 15 0 - 40 IU/L   ALT 16 0 - 32 IU/L  Lipid Profile   Collection Time: 01/01/23  9:34 AM  Result Value Ref Range   Cholesterol, Total 279 (H) 100 - 199 mg/dL   Triglycerides 850 0 - 149 mg/dL   HDL 58 >60 mg/dL   VLDL Cholesterol Cal 27 5 - 40 mg/dL   LDL Chol Calc (NIH) 805 (H) 0 - 99 mg/dL   LDL CALC COMMENT: Comment    Chol/HDL Ratio 4.8 (H) 0.0 - 4.4 ratio      Assessment & Plan:   Problem List Items Addressed This Visit       Other   Obesity (BMI 30-39.9)   Recommended eating smaller high protein, low fat meals more frequently and exercising 30 mins a day 5 times a week with a goal of 10-15lb weight loss in the next 3 months. Will increase dose of Zepbound  to 7.5mg  weekly.  Goal weight is 135lb.  Follow up in 3 months.  Call sooner if concerns arise.        Relevant Medications   tirzepatide  7.5 MG/0.5ML injection vial   Stress - Primary   Chronic.  Controlled.  Continue with current medication regimen of Celexa  40mg .  Refills sent today.  Return to clinic in 6 months for reevaluation.  Call sooner  if concerns arise.             Follow up plan: Return in about 3 months (around 02/06/2024) for Physical and Fasting labs.   This visit was completed via MyChart due to the restrictions of the COVID-19 pandemic. All issues as above were discussed and addressed. Physical exam was done as above through visual confirmation on MyChart. If it was felt that  the patient should be evaluated in the office, they were directed there. The patient verbally consented to this visit. Location of the patient: Home Location of the provider: Office Those involved with this call:  Provider: Darice Petty, NP CMA: Izetta Sarah, CMA Front Desk/Registration: Claretta Maiden This encounter was conducted via video.  I spent 30 mins dedicated to the care of this patient on the date of this encounter to include previsit review of symptoms, plan of care and follow up, face to face time with the patient, and post visit ordering of testing.

## 2023-11-06 NOTE — Assessment & Plan Note (Addendum)
 Recommended eating smaller high protein, low fat meals more frequently and exercising 30 mins a day 5 times a week with a goal of 10-15lb weight loss in the next 3 months. Will increase dose of Zepbound  to 7.5mg  weekly.  Goal weight is 135lb.  Follow up in 3 months.  Call sooner if concerns arise.

## 2023-11-08 MED ORDER — TIRZEPATIDE-WEIGHT MANAGEMENT 7.5 MG/0.5ML ~~LOC~~ SOAJ: 7.5000 mg | SUBCUTANEOUS | 3 refills | Status: DC

## 2023-11-12 ENCOUNTER — Ambulatory Visit
Admission: RE | Admit: 2023-11-12 | Discharge: 2023-11-12 | Disposition: A | Discharge status: Home / Self Care | Source: Ambulatory Visit | Attending: Nurse Practitioner | Admitting: Nurse Practitioner

## 2023-11-12 DIAGNOSIS — Z1231 Encounter for screening mammogram for malignant neoplasm of breast: Secondary | ICD-10-CM | POA: Diagnosis present

## 2023-11-20 ENCOUNTER — Encounter: Payer: Self-pay | Admitting: Podiatry

## 2023-12-10 ENCOUNTER — Other Ambulatory Visit: Payer: Self-pay | Admitting: Nurse Practitioner

## 2023-12-11 NOTE — Telephone Encounter (Signed)
 Requested Prescriptions  Pending Prescriptions Disp Refills   rosuvastatin  (CRESTOR ) 5 MG tablet [Pharmacy Med Name: ROSUVASTATIN  TABS 5MG ] 90 tablet 0    Sig: TAKE 1 TABLET DAILY     Cardiovascular:  Antilipid - Statins 2 Failed - 12/11/2023  2:56 PM      Failed - Lipid Panel in normal range within the last 12 months    Cholesterol, Total  Date Value Ref Range Status  01/01/2023 279 (H) 100 - 199 mg/dL Final   LDL Chol Calc (NIH)  Date Value Ref Range Status  01/01/2023 194 (H) 0 - 99 mg/dL Final   HDL  Date Value Ref Range Status  01/01/2023 58 >39 mg/dL Final   Triglycerides  Date Value Ref Range Status  01/01/2023 149 0 - 149 mg/dL Final         Passed - Cr in normal range and within 360 days    Creatinine, Ser  Date Value Ref Range Status  01/01/2023 0.66 0.57 - 1.00 mg/dL Final         Passed - Patient is not pregnant      Passed - Valid encounter within last 12 months    Recent Outpatient Visits           1 month ago Stress   [place] [facility] [place], Darice, NP   4 months ago Obesity (BMI 30-39.9)   [place] [facility] Melvin Darice, NP   7 months ago Stress   [place] [facility] Melvin Darice, NP

## 2024-01-15 ENCOUNTER — Other Ambulatory Visit: Payer: Self-pay | Admitting: Nurse Practitioner

## 2024-01-16 NOTE — Telephone Encounter (Signed)
 Requested Prescriptions  Pending Prescriptions Disp Refills   citalopram  (CELEXA ) 40 MG tablet [Pharmacy Med Name: CITALOPRAM  HYDROBROMIDE TABS 40MG ] 90 tablet 1    Sig: TAKE 1 TABLET DAILY     Psychiatry:  Antidepressants - SSRI Passed - 01/16/2024  2:15 PM      Passed - Valid encounter within last 6 months    Recent Outpatient Visits           2 months ago Stress   [place] [facility] [place], Darice, NP   5 months ago Obesity (BMI 30-39.9)   [place] [facility] Melvin Darice, NP   8 months ago Stress   [place] [facility] Melvin Darice, NP

## 2024-02-10 ENCOUNTER — Ambulatory Visit: Admitting: Nurse Practitioner

## 2024-02-10 ENCOUNTER — Encounter: Payer: Self-pay | Admitting: Nurse Practitioner

## 2024-02-10 VITALS — BP 123/82 | HR 91 | Temp 98.1°F | Ht 63.78 in | Wt 138.0 lb

## 2024-02-10 DIAGNOSIS — E669 Obesity, unspecified: Secondary | ICD-10-CM

## 2024-02-10 DIAGNOSIS — E7800 Pure hypercholesterolemia, unspecified: Secondary | ICD-10-CM

## 2024-02-10 DIAGNOSIS — F439 Reaction to severe stress, unspecified: Secondary | ICD-10-CM

## 2024-02-10 DIAGNOSIS — Z0000 Encounter for general adult medical examination without abnormal findings: Secondary | ICD-10-CM

## 2024-02-10 DIAGNOSIS — Z23 Encounter for immunization: Secondary | ICD-10-CM

## 2024-02-10 MED ORDER — CITALOPRAM HYDROBROMIDE 40 MG PO TABS: 40.0000 mg | ORAL_TABLET | Freq: Every day | ORAL | 1 refills | Status: AC

## 2024-02-10 MED ORDER — TIRZEPATIDE-WEIGHT MANAGEMENT 7.5 MG/0.5ML ~~LOC~~ SOAJ: 7.5000 mg | SUBCUTANEOUS | 3 refills | Status: AC

## 2024-02-10 NOTE — Assessment & Plan Note (Signed)
 Chronic.  Controlled.  Continue with current medication regimen of Celexa  40mg .  Refills sent today.  Return to clinic in 6 months for reevaluation.  Call sooner if concerns arise.

## 2024-02-10 NOTE — Assessment & Plan Note (Signed)
 Chronic.  Controlled.  Continue with current medication regimen.  Labs ordered today.  Return to clinic in 6 months for reevaluation.  Call sooner if concerns arise.  ? ?

## 2024-02-10 NOTE — Progress Notes (Signed)
 BP 123/82 (BP Location: Right Arm, Patient Position: Sitting, Cuff Size: Normal)   Pulse 91   Temp 98.1 F (36.7 C) (Oral)   Ht 5' 3.78 (1.62 m)   Wt 138 lb (62.6 kg)   LMP 12/23/2023 (Approximate)   SpO2 99%   BMI 23.85 kg/m    Subjective:    Patient ID: Rebekah Cook, female    DOB: 04-02-1980, 43 y.o.   MRN: [id number]  HPI: Rebekah Cook is a 43 y.o. female presenting on 02/10/2024 for comprehensive medical examination. Current medical complaints include:none  She currently lives with: Menopausal Symptoms: no  Patient sates she has been getting headaches that sometimes cause nausea.  Ibuprofen  doesn't help.  Sometimes caffeine tea.  The best thing is laying down and have quiet.    STRESS Feels like her mental health has been overall pretty good.  In July she did find out that her job is moving locations.  She feels like the celexa  is working well for her.  She is having more stress with her job.  She is still taking Adderall which is working well for her.  Denies concerns at visit today.  Denies SI.  WEIGHT MANAGEMENT  Has lost about 70lbs since starting Zepbound .   Feels like she is doing great.   She is getting it through her insurance.  She is happy with her current weight but would love to get to the 140s.  She is able to move better and less winded with regular activities. She is not exercising as much as she wants to but is being more active in her life in general.   Depression Screen done today and results listed below:     02/10/2024   10:54 AM 11/06/2023    1:33 PM 08/06/2023   10:10 AM 05/07/2023    8:43 AM 02/04/2023    2:29 PM  Depression screen PHQ 2/9  Decreased Interest 0 0 0 1 1  Down, Depressed, Hopeless 0 0 0 0 1  PHQ - 2 Score 0 0 0 1 2  Altered sleeping 1 0 0 0 3  Tired, decreased energy 1 0 0 0 3  Change in appetite 0 0 0 1 3  Feeling bad or failure about yourself  0 0  0 0  Trouble concentrating 0 0 0 0 0  Moving slowly or  fidgety/restless 0 0 0 0 0  Suicidal thoughts 0 0 0 0 0  PHQ-9 Score 2 0  0  2  11   Difficult doing work/chores Not difficult at all Not difficult at all  Not difficult at all      Data saved with a previous flowsheet row definition    The patient does not have a history of falls. I did complete a risk assessment for falls. A plan of care for falls was documented.   Past Medical History:  Past Medical History:  Diagnosis Date   Allergy 2007   Complication of anesthesia    HARD TO WAKE UP FROM WISDOM TEETH   Family history of adverse reaction to anesthesia    MOM-   GERD (gastroesophageal reflux disease)    Gestational diabetes    PCOS (polycystic ovarian syndrome)    PONV (postoperative nausea and vomiting)    WITH PREVIOUS C-SECTION   Vaginal Pap smear, abnormal    2009    Surgical History:  Past Surgical History:  Procedure Laterality Date   CESAREAN SECTION  07/22/14, 10/13/18   CESAREAN  SECTION N/A 10/13/2018   Procedure: CESAREAN SECTION, REPEAT;  Surgeon: Ward, Mitzie BROCKS, MD;  Location: [facility] ORS;  Service: Obstetrics;  Laterality: N/A;   CYST EXCISION     HYSTEROSCOPY Bilateral 2014   MOUTH SURGERY     TEENAGER   NM RENAL LASIX  ([facility] HX) Bilateral 05/11/2021   WISDOM TOOTH EXTRACTION Bilateral     Medications:  Current Outpatient Medications on File Prior to Visit  Medication Sig   amphetamine-dextroamphetamine (ADDERALL) 20 MG tablet Take 20 mg by mouth daily.   betamethasone dipropionate 0.05 % lotion SMARTSIG:5 Milliliter(s) Topical 3 Times a Week   citalopram  (CELEXA ) 40 MG tablet TAKE 1 TABLET DAILY   glycopyrrolate  (ROBINUL ) 1 MG tablet    Multiple Vitamin (MULTIVITAMIN) tablet Take 1 tablet by mouth daily.   nystatin (MYCOSTATIN/NYSTOP) powder SMARTSIG:1 Gram(s) Topical 1-2 Times Daily   rosuvastatin  (CRESTOR ) 5 MG tablet TAKE 1 TABLET DAILY   tirzepatide  (ZEPBOUND ) 7.5 MG/0.5ML Pen Inject 7.5 mg into the skin once a week.   No current  facility-administered medications on file prior to visit.    Allergies:  Allergies  Allergen Reactions   Penicillins Swelling    Swelling of gums Did it involve swelling of the face/tongue/throat, SOB, or low BP? No Did it involve sudden or severe rash/hives, skin peeling, or any reaction on the inside of your mouth or nose? No Did you need to seek medical attention at a hospital or doctor's office? No When did it last happen?      2009 If all above answers are "NO", may proceed with cephalosporin use.    Levofloxacin Nausea And Vomiting    Social History:  Social History   Socioeconomic History   Marital status: Married    Spouse name: Selinda   Number of children: Not on file   Years of education: Not on file   Highest education level: Bachelor's degree (e.g., BA, AB, BS)  Occupational History   Occupation: [employer]  Tobacco Use   Smoking status: Never   Smokeless tobacco: Never  Vaping Use   Vaping status: Never Used  Substance and Sexual Activity   Alcohol use: Not Currently    Comment: 1 -2 times yearly   Drug use: Never   Sexual activity: Not Currently    Birth control/protection: Abstinence, Pill  Other Topics Concern   Not on file  Social History Narrative   Caffeine sweet or hot tea two cups daily (10 uz).   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    Married, 2 kids (7 and 3 yr old).   Social Drivers of Corporate Investment Banker Strain: Low Risk  (02/09/2024)   Overall Financial Resource Strain (CARDIA)    Difficulty of Paying Living Expenses: Not hard at all  Food Insecurity: No Food Insecurity (02/09/2024)   Hunger Vital Sign    Worried About Running Out of Food in the Last Year: Never true    Ran Out of Food in the Last Year: Never true  Transportation Needs: No Transportation Needs (02/09/2024)   PRAPARE - Administrator, Civil Service (Medical): No    Lack of Transportation (Non-Medical): No  Physical Activity:  Insufficiently Active (02/09/2024)   Exercise Vital Sign    Days of Exercise per Week: 3 days    Minutes of Exercise per Session: 20 min  Stress: Stress Concern Present (02/09/2024)   [employer] of Occupational Health - Occupational Stress Questionnaire  Feeling of Stress: To some extent  Social Connections: Moderately Isolated (02/09/2024)   Social Connection and Isolation Panel    Frequency of Communication with Friends and Family: More than three times a week    Frequency of Social Gatherings with Friends and Family: Twice a week    Attends Religious Services: Never    Diplomatic Services Operational Officer: No    Attends Engineer, Structural: Not on file    Marital Status: Married  Catering Manager Violence: Not on file   Social History   Tobacco Use  Smoking Status Never  Smokeless Tobacco Never   Social History   Substance and Sexual Activity  Alcohol Use Not Currently   Comment: 1 -2 times yearly    Family History:  Family History  Problem Relation Age of Onset   Kidney disease Mother    Cancer Mother        kidney   Miscarriages / Stillbirths Mother    Varicose Veins Mother    Cancer Father        prostate   Heart disease Father    Parkinson's disease Maternal Aunt    Cancer Paternal Uncle    Cancer Maternal Grandmother    Diabetes Maternal Grandmother    Arthritis Maternal Grandmother    Cancer Maternal Grandfather    Arthritis Maternal Grandfather    Stroke Maternal Grandfather    Diabetes Paternal Grandmother    Stroke Paternal Grandmother    Parkinson's disease Paternal Grandfather    Alzheimer's disease Paternal Grandfather     Past medical history, surgical history, medications, allergies, family history and social history reviewed with patient today and changes made to appropriate areas of the chart.   Review of Systems  Psychiatric/Behavioral:  Positive for depression. Negative for suicidal ideas.    All other ROS negative  except what is listed above and in the HPI.      Objective:    BP 123/82 (BP Location: Right Arm, Patient Position: Sitting, Cuff Size: Normal)   Pulse 91   Temp 98.1 F (36.7 C) (Oral)   Ht 5' 3.78 (1.62 m)   Wt 138 lb (62.6 kg)   LMP 12/23/2023 (Approximate)   SpO2 99%   BMI 23.85 kg/m   Wt Readings from Last 3 Encounters:  02/10/24 138 lb (62.6 kg)  11/06/23 155 lb 6.4 oz (70.5 kg)  05/07/23 210 lb (95.3 kg)    Physical Exam Vitals and nursing note reviewed.  Constitutional:      General: She is awake. She is not in acute distress.    Appearance: Normal appearance. She is well-developed. She is not ill-appearing.  HENT:     Head: Normocephalic and atraumatic.     Right Ear: Hearing, tympanic membrane, ear canal and external ear normal. No drainage.     Left Ear: Hearing, tympanic membrane, ear canal and external ear normal. No drainage.     Nose: Nose normal.     Right Sinus: No maxillary sinus tenderness or frontal sinus tenderness.     Left Sinus: No maxillary sinus tenderness or frontal sinus tenderness.     Mouth/Throat:     Mouth: Mucous membranes are moist.     Pharynx: Oropharynx is clear. Uvula midline. No pharyngeal swelling, oropharyngeal exudate or posterior oropharyngeal erythema.  Eyes:     General: Lids are normal.        Right eye: No discharge.        Left eye: No discharge.  Extraocular Movements: Extraocular movements intact.     Conjunctiva/sclera: Conjunctivae normal.     Pupils: Pupils are equal, round, and reactive to light.     Visual Fields: Right eye visual fields normal and left eye visual fields normal.  Neck:     Thyroid : No thyromegaly.     Vascular: No carotid bruit.     Trachea: Trachea normal.  Cardiovascular:     Rate and Rhythm: Normal rate and regular rhythm.     Heart sounds: Normal heart sounds. No murmur heard.    No gallop.  Pulmonary:     Effort: Pulmonary effort is normal. No accessory muscle usage or respiratory  distress.     Breath sounds: Normal breath sounds.  Chest:  Breasts:    Right: Normal.     Left: Normal.  Abdominal:     General: Bowel sounds are normal.     Palpations: Abdomen is soft. There is no hepatomegaly or splenomegaly.     Tenderness: There is no abdominal tenderness.  Musculoskeletal:        General: Normal range of motion.     Cervical back: Normal range of motion and neck supple.     Right lower leg: No edema.     Left lower leg: No edema.  Lymphadenopathy:     Head:     Right side of head: No submental, submandibular, tonsillar, preauricular or posterior auricular adenopathy.     Left side of head: No submental, submandibular, tonsillar, preauricular or posterior auricular adenopathy.     Cervical: No cervical adenopathy.     Upper Body:     Right upper body: No supraclavicular, axillary or pectoral adenopathy.     Left upper body: No supraclavicular, axillary or pectoral adenopathy.  Skin:    General: Skin is warm and dry.     Capillary Refill: Capillary refill takes less than 2 seconds.     Findings: No rash.  Neurological:     Mental Status: She is alert and oriented to person, place, and time.     Gait: Gait is intact.  Psychiatric:        Attention and Perception: Attention normal.        Mood and Affect: Mood normal.        Speech: Speech normal.        Behavior: Behavior normal. Behavior is cooperative.        Thought Content: Thought content normal.        Judgment: Judgment normal.     Results for orders placed or performed in visit on 01/01/23  Comp Met (CMET)   Collection Time: 01/01/23  9:34 AM  Result Value Ref Range   Glucose 84 70 - 99 mg/dL   BUN 10 6 - 24 mg/dL   Creatinine, Ser 9.33 0.57 - 1.00 mg/dL   eGFR 886 >40 fO/fpw/8.26   BUN/Creatinine Ratio 15 9 - 23   Sodium 138 134 - 144 mmol/L   Potassium 4.5 3.5 - 5.2 mmol/L   Chloride 101 96 - 106 mmol/L   CO2 18 (L) 20 - 29 mmol/L   Calcium  10.6 (H) 8.7 - 10.2 mg/dL   Total Protein  7.5 6.0 - 8.5 g/dL   Albumin 4.4 3.9 - 4.9 g/dL   Globulin, Total 3.1 1.5 - 4.5 g/dL   Bilirubin Total <9.7 0.0 - 1.2 mg/dL   Alkaline Phosphatase 68 44 - 121 IU/L   AST 15 0 - 40 IU/L   ALT 16 0 -  32 IU/L  Lipid Profile   Collection Time: 01/01/23  9:34 AM  Result Value Ref Range   Cholesterol, Total 279 (H) 100 - 199 mg/dL   Triglycerides 850 0 - 149 mg/dL   HDL 58 >60 mg/dL   VLDL Cholesterol Cal 27 5 - 40 mg/dL   LDL Chol Calc (NIH) 805 (H) 0 - 99 mg/dL   LDL CALC COMMENT: Comment    Chol/HDL Ratio 4.8 (H) 0.0 - 4.4 ratio      Assessment & Plan:   Problem List Items Addressed This Visit       Other   Stress   Hypercholesteremia   Other Visit Diagnoses       Annual physical exam    -  Primary     Flu vaccine need       Relevant Orders   Flu vaccine trivalent PF, 6mos and older(Flulaval,Afluria,Fluarix,Fluzone) (Completed)     Need for COVID-19 vaccine       Relevant Orders   Pfizer Comirnaty Covid-19 Vaccine 65yrs & older (Completed)        Follow up plan: No follow-ups on file.   LABORATORY TESTING:  - Pap smear: up to date  IMMUNIZATIONS:   - Tdap: Tetanus vaccination status reviewed: last tetanus booster within 10 years. - Influenza: Up to date - Pneumovax: Not applicable - Prevnar: Not applicable - COVID: Up to date - HPV: Not applicable - Shingrix vaccine: Not applicable  SCREENING: -Mammogram: Up to date  - Colonoscopy: Not applicable  - Bone Density: Not applicable  -Hearing Test: Not applicable  -Spirometry: Not applicable   PATIENT COUNSELING:   Advised to take 1 mg of folate supplement per day if capable of pregnancy.   Sexuality: Discussed sexually transmitted diseases, partner selection, use of condoms, avoidance of unintended pregnancy  and contraceptive alternatives.   Advised to avoid cigarette smoking.  I discussed with the patient that most people either abstain from alcohol or drink within safe limits (<=14/week and <=4  drinks/occasion for males, <=7/weeks and <= 3 drinks/occasion for females) and that the risk for alcohol disorders and other health effects rises proportionally with the number of drinks per week and how often a drinker exceeds daily limits.  Discussed cessation/primary prevention of drug use and availability of treatment for abuse.   Diet: Encouraged to adjust caloric intake to maintain  or achieve ideal body weight, to reduce intake of dietary saturated fat and total fat, to limit sodium intake by avoiding high sodium foods and not adding table salt, and to maintain adequate dietary potassium and calcium  preferably from fresh fruits, vegetables, and low-fat dairy products.    stressed the importance of regular exercise  Injury prevention: Discussed safety belts, safety helmets, smoke detector, smoking near bedding or upholstery.   Dental health: Discussed importance of regular tooth brushing, flossing, and dental visits.    NEXT PREVENTATIVE PHYSICAL DUE IN 1 YEAR. No follow-ups on file.

## 2024-02-10 NOTE — Assessment & Plan Note (Signed)
 Well controlled.  Has lost 70lbs on Zepbound .  Continues on the 7.5mg  dose.

## 2024-02-11 ENCOUNTER — Other Ambulatory Visit (HOSPITAL_COMMUNITY): Payer: Self-pay

## 2024-02-11 ENCOUNTER — Ambulatory Visit: Payer: Self-pay | Admitting: Nurse Practitioner

## 2024-02-11 LAB — CBC WITH DIFFERENTIAL/PLATELET
Basophils Absolute: 0 x10E3/uL (ref 0.0–0.2)
Basos: 0 %
EOS (ABSOLUTE): 0.1 x10E3/uL (ref 0.0–0.4)
Eos: 1 %
Hematocrit: 40.2 % (ref 34.0–46.6)
Hemoglobin: 12.9 g/dL (ref 11.1–15.9)
Immature Grans (Abs): 0 x10E3/uL (ref 0.0–0.1)
Immature Granulocytes: 0 %
Lymphocytes Absolute: 1.9 x10E3/uL (ref 0.7–3.1)
Lymphs: 30 %
MCH: 28.7 pg (ref 26.6–33.0)
MCHC: 32.1 g/dL (ref 31.5–35.7)
MCV: 90 fL (ref 79–97)
Monocytes Absolute: 0.3 x10E3/uL (ref 0.1–0.9)
Monocytes: 5 %
Neutrophils Absolute: 3.9 x10E3/uL (ref 1.4–7.0)
Neutrophils: 64 %
Platelets: 261 x10E3/uL (ref 150–450)
RBC: 4.49 x10E6/uL (ref 3.77–5.28)
RDW: 12 % (ref 11.7–15.4)
WBC: 6.1 x10E3/uL (ref 3.4–10.8)

## 2024-02-11 LAB — LIPID PANEL
Chol/HDL Ratio: 2.4 ratio (ref 0.0–4.4)
Cholesterol, Total: 133 mg/dL (ref 100–199)
HDL: 55 mg/dL (ref >39)
LDL Chol Calc (NIH): 68 mg/dL (ref 0–99)
Triglycerides: 40 mg/dL (ref 0–149)
VLDL Cholesterol Cal: 10 mg/dL (ref 5–40)

## 2024-02-11 LAB — COMPREHENSIVE METABOLIC PANEL WITH GFR
ALT: 22 IU/L (ref 0–32)
AST: 19 IU/L (ref 0–40)
Albumin: 4.7 g/dL (ref 3.9–4.9)
Alkaline Phosphatase: 100 IU/L (ref 41–116)
BUN/Creatinine Ratio: 13 (ref 9–23)
BUN: 12 mg/dL (ref 6–24)
Bilirubin Total: 0.5 mg/dL (ref 0.0–1.2)
CO2: 21 mmol/L (ref 20–29)
Calcium: 10.2 mg/dL (ref 8.7–10.2)
Chloride: 102 mmol/L (ref 96–106)
Creatinine, Ser: 0.9 mg/dL (ref 0.57–1.00)
Globulin, Total: 2.2 g/dL (ref 1.5–4.5)
Glucose: 79 mg/dL (ref 70–99)
Potassium: 4.3 mmol/L (ref 3.5–5.2)
Sodium: 137 mmol/L (ref 134–144)
Total Protein: 6.9 g/dL (ref 6.0–8.5)
eGFR: 82 mL/min/1.73 (ref >59)

## 2024-02-11 LAB — TSH: TSH: 2.52 u[IU]/mL (ref 0.450–4.500)

## 2024-02-12 ENCOUNTER — Telehealth: Payer: Self-pay | Admitting: Pharmacy Technician

## 2024-02-12 ENCOUNTER — Other Ambulatory Visit (HOSPITAL_COMMUNITY): Payer: Self-pay

## 2024-02-12 NOTE — Telephone Encounter (Signed)
 Pharmacy Patient Advocate Encounter   Received notification from Onbase that prior authorization for Zepbound  7.5MG /0.5ML pen-injectors is required/requested.   Insurance verification completed.   The patient is insured through [employer].   Per test claim: PA required; PA submitted to above mentioned insurance via Latent Key/confirmation #/EOC [id number] Status is pending

## 2024-02-18 ENCOUNTER — Other Ambulatory Visit (HOSPITAL_COMMUNITY): Payer: Self-pay

## 2024-02-18 NOTE — Telephone Encounter (Signed)
 Pharmacy Patient Advocate Encounter  Received notification from [facility] that Prior Authorization for Zepbound  7.5MG /0.5ML pen-injectors has been APPROVED from 01/13/2024 to 02/17/2025. Ran test claim, Copay is $24.99. This test claim was processed through [facility]- copay amounts may vary at other pharmacies due to pharmacy/plan contracts, or as the patient moves through the different stages of their insurance plan.   PA #/Case ID/Reference #: [id number]

## 2024-02-27 ENCOUNTER — Other Ambulatory Visit: Payer: Self-pay | Admitting: Nurse Practitioner

## 2024-02-29 NOTE — Telephone Encounter (Signed)
 Discontinued 11/06/23, dose change.  Requested Prescriptions  Pending Prescriptions Disp Refills   HAILEY FE 1.5/30 1.5-30 MG-MCG tablet [Pharmacy Med Name: NORETH/E ES FE(HAILEY)TAB 28S 1.5MG /30MCG] 84 tablet 3    Sig: TAKE 1 TABLET DAILY     OB/GYN:  Contraceptives Passed - 02/29/2024  9:17 AM      Passed - Last BP in normal range    BP Readings from Last 1 Encounters:  02/10/24 123/82         Passed - Valid encounter within last 12 months    Recent Outpatient Visits           2 weeks ago Annual physical exam   [place] [facility] Melvin Pao, NP   3 months ago Stress   [place] [facility], Pao, NP   6 months ago Obesity (BMI 30-39.9)   [place] [facility] Melvin Pao, NP   9 months ago Stress   [place] [facility] Melvin Pao, NP              Passed - Patient is not a smoker

## 2024-03-03 ENCOUNTER — Other Ambulatory Visit (HOSPITAL_COMMUNITY): Payer: Self-pay

## 2024-03-06 ENCOUNTER — Other Ambulatory Visit (HOSPITAL_COMMUNITY): Payer: Self-pay

## 2024-03-09 ENCOUNTER — Other Ambulatory Visit: Payer: Self-pay | Admitting: Nurse Practitioner

## 2024-03-10 ENCOUNTER — Encounter: Payer: Self-pay | Admitting: Nurse Practitioner

## 2024-03-18 ENCOUNTER — Telehealth: Admitting: Nurse Practitioner

## 2024-03-18 ENCOUNTER — Encounter: Payer: Self-pay | Admitting: Nurse Practitioner

## 2024-03-18 VITALS — Ht 63.78 in | Wt 134.8 lb

## 2024-03-18 DIAGNOSIS — Z309 Encounter for contraceptive management, unspecified: Secondary | ICD-10-CM | POA: Diagnosis not present

## 2024-03-18 MED ORDER — JUNEL FE 1/20 1-20 MG-MCG PO TABS: 1.0000 | ORAL_TABLET | Freq: Every day | ORAL | 3 refills | Status: AC

## 2024-03-18 NOTE — Progress Notes (Signed)
 "  Ht 5' 3.78 (1.62 m)   Wt 134 lb 12.8 oz (61.1 kg)   LMP 01/10/2024 (Approximate)   BMI 23.30 kg/m    Subjective:    Patient ID: Rebekah Cook, female    DOB: 19-Jun-1980, 44 y.o.   MRN: [id number]  HPI: Rebekah Cook is a 44 y.o. female  Chief Complaint  Patient presents with   Medication Refill   Patient seen today via telehealth visit to discuss her birth control.  States she doesn't like the seasonale.  Having only 4 periods a year made her symptoms around her cycle worse.  She has been having spotting for about a month and a half.  Her cycle lasted about 2 weeks when she got her cycle.  States she felt like the amount of blood she was losing was a lot.  She stopped the birth control after that due to not feeling like it was working.       Relevant past medical, surgical, family and social history reviewed and updated as indicated. Interim medical history since our last visit reviewed. Allergies and medications reviewed and updated.  Review of Systems  Genitourinary:  Positive for vaginal bleeding and vaginal pain.    Per HPI unless specifically indicated above     Objective:    Ht 5' 3.78 (1.62 m)   Wt 134 lb 12.8 oz (61.1 kg)   LMP 01/10/2024 (Approximate)   BMI 23.30 kg/m   Wt Readings from Last 3 Encounters:  03/18/24 134 lb 12.8 oz (61.1 kg)  02/10/24 138 lb (62.6 kg)  11/06/23 155 lb 6.4 oz (70.5 kg)    Physical Exam Vitals and nursing note reviewed.  HENT:     Head: Normocephalic.     Right Ear: Hearing normal.     Left Ear: Hearing normal.     Nose: Nose normal.  Eyes:     Pupils: Pupils are equal, round, and reactive to light.  Pulmonary:     Effort: Pulmonary effort is normal. No respiratory distress.  Neurological:     Mental Status: She is alert.  Psychiatric:        Mood and Affect: Mood normal.        Behavior: Behavior normal.        Thought Content: Thought content normal.        Judgment: Judgment normal.     Results  for orders placed or performed in visit on 02/10/24  CBC with Differential/Platelet   Collection Time: 02/10/24 11:17 AM  Result Value Ref Range   WBC 6.1 3.4 - 10.8 x10E3/uL   RBC 4.49 3.77 - 5.28 x10E6/uL   Hemoglobin 12.9 11.1 - 15.9 g/dL   Hematocrit 59.7 65.9 - 46.6 %   MCV 90 79 - 97 fL   MCH 28.7 26.6 - 33.0 pg   MCHC 32.1 31.5 - 35.7 g/dL   RDW 87.9 88.2 - 84.5 %   Platelets 261 150 - 450 x10E3/uL   Neutrophils 64 Not Estab. %   Lymphs 30 Not Estab. %   Monocytes 5 Not Estab. %   Eos 1 Not Estab. %   Basos 0 Not Estab. %   Neutrophils Absolute 3.9 1.4 - 7.0 x10E3/uL   Lymphocytes Absolute 1.9 0.7 - 3.1 x10E3/uL   Monocytes Absolute 0.3 0.1 - 0.9 x10E3/uL   EOS (ABSOLUTE) 0.1 0.0 - 0.4 x10E3/uL   Basophils Absolute 0.0 0.0 - 0.2 x10E3/uL   Immature Granulocytes 0 Not Estab. %   Immature  Grans (Abs) 0.0 0.0 - 0.1 x10E3/uL  Comprehensive metabolic panel with GFR   Collection Time: 02/10/24 11:17 AM  Result Value Ref Range   Glucose 79 70 - 99 mg/dL   BUN 12 6 - 24 mg/dL   Creatinine, Ser 9.09 0.57 - 1.00 mg/dL   eGFR 82 >40 fO/fpw/8.26   BUN/Creatinine Ratio 13 9 - 23   Sodium 137 134 - 144 mmol/L   Potassium 4.3 3.5 - 5.2 mmol/L   Chloride 102 96 - 106 mmol/L   CO2 21 20 - 29 mmol/L   Calcium  10.2 8.7 - 10.2 mg/dL   Total Protein 6.9 6.0 - 8.5 g/dL   Albumin 4.7 3.9 - 4.9 g/dL   Globulin, Total 2.2 1.5 - 4.5 g/dL   Bilirubin Total 0.5 0.0 - 1.2 mg/dL   Alkaline Phosphatase 100 41 - 116 IU/L   AST 19 0 - 40 IU/L   ALT 22 0 - 32 IU/L  Lipid panel   Collection Time: 02/10/24 11:17 AM  Result Value Ref Range   Cholesterol, Total 133 100 - 199 mg/dL   Triglycerides 40 0 - 149 mg/dL   HDL 55 >60 mg/dL   VLDL Cholesterol Cal 10 5 - 40 mg/dL   LDL Chol Calc (NIH) 68 0 - 99 mg/dL   Chol/HDL Ratio 2.4 0.0 - 4.4 ratio  TSH   Collection Time: 02/10/24 11:17 AM  Result Value Ref Range   TSH 2.520 0.450 - 4.500 uIU/mL      Assessment & Plan:   Problem List Items  Addressed This Visit   None Visit Diagnoses       Encounter for contraceptive management, unspecified type    -  Primary   Reviewed birth control options and previous experiences with birth control. Will restart Junel .  Side effects and benefits discussed.  Follow up if not improved        Follow up plan: No follow-ups on file.   This visit was completed via MyChart due to the restrictions of the COVID-19 pandemic. All issues as above were discussed and addressed. Physical exam was done as above through visual confirmation on MyChart. If it was felt that the patient should be evaluated in the office, they were directed there. The patient verbally consented to this visit. Location of the patient: Home Location of the provider: Office Those involved with this call:  Provider: Darice Petty, NP CMA: Joya Louder, CMA Front Desk/Registration: Claretta Maiden This encounter was conducted via video.  I spent 30 minutes dedicated to the care of this patient on the date of this encounter to include previsit review of plan of care, follow up and medications, face to face time with the patient, and post visit ordering of testing.      "

## 2024-03-20 ENCOUNTER — Ambulatory Visit: Payer: Self-pay | Admitting: Nurse Practitioner

## 2024-08-11 ENCOUNTER — Ambulatory Visit: Payer: Self-pay | Admitting: Nurse Practitioner
